# Patient Record
Sex: Female | Born: 1945 | Race: White | Hispanic: No | Marital: Married | Smoking: Former smoker
Health system: Southern US, Community
[De-identification: ages and names within clinical notes are randomized; demographics above are authoritative.]

## PROBLEM LIST (undated history)

## (undated) DIAGNOSIS — E785 Hyperlipidemia, unspecified: Secondary | ICD-10-CM

## (undated) DIAGNOSIS — G2581 Restless legs syndrome: Secondary | ICD-10-CM

## (undated) DIAGNOSIS — C4490 Unspecified malignant neoplasm of skin, unspecified: Secondary | ICD-10-CM

## (undated) HISTORY — DX: Restless legs syndrome: G25.81

## (undated) HISTORY — DX: Hyperlipidemia, unspecified: E78.5

## (undated) HISTORY — PX: CATARACT EXTRACTION: SUR2

## (undated) HISTORY — PX: TOTAL ABDOMINAL HYSTERECTOMY W/ BILATERAL SALPINGOOPHORECTOMY: SHX83

## (undated) HISTORY — PX: COLONOSCOPY: SHX174

## (undated) HISTORY — PX: TONSILLECTOMY: SUR1361

## (undated) HISTORY — DX: Unspecified malignant neoplasm of skin, unspecified: C44.90

## (undated) HISTORY — PX: GLAUCOMA SURGERY: SHX656

---

## 1999-08-26 ENCOUNTER — Encounter: Payer: Self-pay | Admitting: Obstetrics and Gynecology

## 1999-08-26 ENCOUNTER — Encounter: Admission: RE | Admit: 1999-08-26 | Discharge: 1999-08-26 | Payer: Self-pay | Admitting: Obstetrics and Gynecology

## 2000-09-14 ENCOUNTER — Encounter: Admission: RE | Admit: 2000-09-14 | Discharge: 2000-09-14 | Payer: Self-pay | Admitting: Obstetrics and Gynecology

## 2000-09-14 ENCOUNTER — Encounter: Payer: Self-pay | Admitting: Obstetrics and Gynecology

## 2001-01-11 HISTORY — PX: CYSTOSCOPY: SUR368

## 2001-06-14 ENCOUNTER — Encounter: Payer: Self-pay | Admitting: Internal Medicine

## 2001-09-28 ENCOUNTER — Encounter: Admission: RE | Admit: 2001-09-28 | Discharge: 2001-09-28 | Payer: Self-pay | Admitting: Obstetrics and Gynecology

## 2001-09-28 ENCOUNTER — Encounter: Payer: Self-pay | Admitting: Obstetrics and Gynecology

## 2001-12-15 ENCOUNTER — Encounter: Payer: Self-pay | Admitting: Internal Medicine

## 2001-12-15 ENCOUNTER — Encounter: Admission: RE | Admit: 2001-12-15 | Discharge: 2001-12-15 | Payer: Self-pay | Admitting: Internal Medicine

## 2002-10-10 ENCOUNTER — Encounter: Payer: Self-pay | Admitting: Obstetrics and Gynecology

## 2002-10-10 ENCOUNTER — Encounter: Admission: RE | Admit: 2002-10-10 | Discharge: 2002-10-10 | Payer: Self-pay | Admitting: Obstetrics and Gynecology

## 2002-10-26 ENCOUNTER — Encounter: Payer: Self-pay | Admitting: Family Medicine

## 2002-10-26 ENCOUNTER — Encounter: Admission: RE | Admit: 2002-10-26 | Discharge: 2002-10-26 | Payer: Self-pay | Admitting: Family Medicine

## 2003-10-21 ENCOUNTER — Encounter: Admission: RE | Admit: 2003-10-21 | Discharge: 2003-10-21 | Payer: Self-pay | Admitting: Obstetrics and Gynecology

## 2003-12-31 ENCOUNTER — Ambulatory Visit: Payer: Self-pay | Admitting: Internal Medicine

## 2004-01-01 ENCOUNTER — Ambulatory Visit: Payer: Self-pay | Admitting: Internal Medicine

## 2004-01-07 ENCOUNTER — Encounter: Admission: RE | Admit: 2004-01-07 | Discharge: 2004-01-07 | Payer: Self-pay | Admitting: Internal Medicine

## 2004-04-10 ENCOUNTER — Ambulatory Visit: Payer: Self-pay | Admitting: Internal Medicine

## 2004-04-30 ENCOUNTER — Encounter: Admission: RE | Admit: 2004-04-30 | Discharge: 2004-04-30 | Payer: Self-pay | Admitting: Internal Medicine

## 2004-08-19 ENCOUNTER — Ambulatory Visit: Payer: Self-pay | Admitting: Internal Medicine

## 2004-09-10 ENCOUNTER — Ambulatory Visit: Payer: Self-pay | Admitting: Internal Medicine

## 2005-01-15 ENCOUNTER — Encounter: Admission: RE | Admit: 2005-01-15 | Discharge: 2005-01-15 | Payer: Self-pay | Admitting: Internal Medicine

## 2005-06-11 ENCOUNTER — Encounter: Admission: RE | Admit: 2005-06-11 | Discharge: 2005-06-11 | Payer: Self-pay | Admitting: Internal Medicine

## 2006-01-13 ENCOUNTER — Ambulatory Visit: Payer: Self-pay | Admitting: Internal Medicine

## 2006-01-13 LAB — CONVERTED CEMR LAB
AST: 19 units/L (ref 0–37)
Albumin: 4.1 g/dL (ref 3.5–5.2)
Alkaline Phosphatase: 75 units/L (ref 39–117)
BUN: 11 mg/dL (ref 6–23)
Basophils Relative: 0.5 % (ref 0.0–1.0)
CO2: 28 meq/L (ref 19–32)
Calcium: 9.2 mg/dL (ref 8.4–10.5)
Eosinophil percent: 0.9 % (ref 0.0–5.0)
GFR calc non Af Amer: 77 mL/min
HCT: 42.2 % (ref 36.0–46.0)
Hemoglobin: 14.5 g/dL (ref 12.0–15.0)
LDL DIRECT: 104.5 mg/dL
Lymphocytes Relative: 19.1 % (ref 12.0–46.0)
Monocytes Absolute: 0.5 10*3/uL (ref 0.2–0.7)
Neutrophils Relative %: 71.3 % (ref 43.0–77.0)
Potassium: 4.1 meq/L (ref 3.5–5.1)
TSH: 1.5 microintl units/mL (ref 0.35–5.50)
Total Bilirubin: 1.1 mg/dL (ref 0.3–1.2)
Total Protein: 6.9 g/dL (ref 6.0–8.3)
Triglyceride fasting, serum: 48 mg/dL (ref 0–149)
WBC: 6.3 10*3/uL (ref 4.5–10.5)

## 2006-01-26 ENCOUNTER — Encounter: Admission: RE | Admit: 2006-01-26 | Discharge: 2006-01-26 | Payer: Self-pay | Admitting: Internal Medicine

## 2006-06-15 ENCOUNTER — Ambulatory Visit: Payer: Self-pay | Admitting: Internal Medicine

## 2006-06-15 LAB — CONVERTED CEMR LAB
Glucose, Urine, Semiquant: NEGATIVE
Ketones, urine, test strip: NEGATIVE
Protein, U semiquant: NEGATIVE
WBC Urine, dipstick: NEGATIVE
pH: 6.5

## 2006-09-26 ENCOUNTER — Ambulatory Visit: Payer: Self-pay | Admitting: Family Medicine

## 2006-09-26 LAB — CONVERTED CEMR LAB
Ketones, urine, test strip: NEGATIVE
Nitrite: NEGATIVE
Protein, U semiquant: NEGATIVE
pH: 6

## 2006-09-27 ENCOUNTER — Encounter: Payer: Self-pay | Admitting: Family Medicine

## 2006-10-04 ENCOUNTER — Encounter (INDEPENDENT_AMBULATORY_CARE_PROVIDER_SITE_OTHER): Payer: Self-pay | Admitting: *Deleted

## 2007-02-22 ENCOUNTER — Encounter: Admission: RE | Admit: 2007-02-22 | Discharge: 2007-02-22 | Payer: Self-pay | Admitting: Internal Medicine

## 2007-04-24 ENCOUNTER — Ambulatory Visit: Payer: Self-pay | Admitting: Internal Medicine

## 2007-04-24 LAB — CONVERTED CEMR LAB: Uric Acid, Serum: 3.8 mg/dL (ref 2.4–7.0)

## 2007-04-25 ENCOUNTER — Ambulatory Visit: Payer: Self-pay | Admitting: Internal Medicine

## 2007-05-31 ENCOUNTER — Ambulatory Visit: Payer: Self-pay | Admitting: Internal Medicine

## 2007-05-31 DIAGNOSIS — N959 Unspecified menopausal and perimenopausal disorder: Secondary | ICD-10-CM | POA: Insufficient documentation

## 2007-05-31 DIAGNOSIS — R7989 Other specified abnormal findings of blood chemistry: Secondary | ICD-10-CM | POA: Insufficient documentation

## 2007-05-31 DIAGNOSIS — M2550 Pain in unspecified joint: Secondary | ICD-10-CM | POA: Insufficient documentation

## 2007-05-31 DIAGNOSIS — Z85828 Personal history of other malignant neoplasm of skin: Secondary | ICD-10-CM

## 2007-05-31 LAB — CONVERTED CEMR LAB
ALT: 16 units/L (ref 0–35)
Alkaline Phosphatase: 61 units/L (ref 39–117)
Basophils Relative: 0.5 % (ref 0.0–1.0)
Bilirubin, Direct: 0.1 mg/dL (ref 0.0–0.3)
CO2: 29 meq/L (ref 19–32)
Chloride: 105 meq/L (ref 96–112)
Cholesterol: 207 mg/dL (ref 0–200)
Direct LDL: 98.7 mg/dL
Eosinophils Absolute: 0.1 10*3/uL (ref 0.0–0.7)
Eosinophils Relative: 2.1 % (ref 0.0–5.0)
GFR calc non Af Amer: 77 mL/min
Hemoglobin: 13.8 g/dL (ref 12.0–15.0)
MCV: 94.8 fL (ref 78.0–100.0)
Neutro Abs: 3.4 10*3/uL (ref 1.4–7.7)
Potassium: 3.8 meq/L (ref 3.5–5.1)
Sed Rate: 8 mm/hr (ref 0–22)
Sodium: 140 meq/L (ref 135–145)
Total Bilirubin: 0.8 mg/dL (ref 0.3–1.2)
Total CHOL/HDL Ratio: 2.6
Triglycerides: 50 mg/dL (ref 0–149)
WBC: 5.1 10*3/uL (ref 4.5–10.5)

## 2008-05-15 ENCOUNTER — Encounter: Admission: RE | Admit: 2008-05-15 | Discharge: 2008-05-15 | Payer: Self-pay | Admitting: Obstetrics and Gynecology

## 2008-05-21 ENCOUNTER — Encounter: Admission: RE | Admit: 2008-05-21 | Discharge: 2008-05-21 | Payer: Self-pay | Admitting: Obstetrics and Gynecology

## 2008-08-27 ENCOUNTER — Telehealth (INDEPENDENT_AMBULATORY_CARE_PROVIDER_SITE_OTHER): Payer: Self-pay | Admitting: *Deleted

## 2008-09-04 ENCOUNTER — Ambulatory Visit: Payer: Self-pay | Admitting: Internal Medicine

## 2008-09-11 LAB — CONVERTED CEMR LAB
Alkaline Phosphatase: 53 units/L (ref 39–117)
BUN: 15 mg/dL (ref 6–23)
Basophils Absolute: 0 10*3/uL (ref 0.0–0.1)
Bilirubin, Direct: 0 mg/dL (ref 0.0–0.3)
CO2: 30 meq/L (ref 19–32)
Calcium: 8.9 mg/dL (ref 8.4–10.5)
Chloride: 105 meq/L (ref 96–112)
Eosinophils Absolute: 0.1 10*3/uL (ref 0.0–0.7)
Eosinophils Relative: 2.1 % (ref 0.0–5.0)
Glucose, Bld: 92 mg/dL (ref 70–99)
HCT: 40.9 % (ref 36.0–46.0)
Lymphocytes Relative: 28.5 % (ref 12.0–46.0)
MCHC: 35 g/dL (ref 30.0–36.0)
Monocytes Relative: 10.4 % (ref 3.0–12.0)
Neutro Abs: 2.8 10*3/uL (ref 1.4–7.7)
Neutrophils Relative %: 58.9 % (ref 43.0–77.0)
Platelets: 219 10*3/uL (ref 150.0–400.0)
RBC: 4.31 M/uL (ref 3.87–5.11)
RDW: 12.5 % (ref 11.5–14.6)
Total Bilirubin: 1.1 mg/dL (ref 0.3–1.2)
Total CHOL/HDL Ratio: 2
VLDL: 11.6 mg/dL (ref 0.0–40.0)

## 2008-09-12 ENCOUNTER — Ambulatory Visit: Payer: Self-pay | Admitting: Internal Medicine

## 2008-09-12 DIAGNOSIS — E785 Hyperlipidemia, unspecified: Secondary | ICD-10-CM

## 2008-09-12 DIAGNOSIS — R9431 Abnormal electrocardiogram [ECG] [EKG]: Secondary | ICD-10-CM

## 2008-09-12 DIAGNOSIS — G2581 Restless legs syndrome: Secondary | ICD-10-CM | POA: Insufficient documentation

## 2008-09-12 DIAGNOSIS — IMO0001 Reserved for inherently not codable concepts without codable children: Secondary | ICD-10-CM

## 2008-09-12 DIAGNOSIS — M5430 Sciatica, unspecified side: Secondary | ICD-10-CM

## 2008-09-17 ENCOUNTER — Encounter (INDEPENDENT_AMBULATORY_CARE_PROVIDER_SITE_OTHER): Payer: Self-pay | Admitting: *Deleted

## 2008-10-17 ENCOUNTER — Encounter: Payer: Self-pay | Admitting: Internal Medicine

## 2008-11-19 ENCOUNTER — Encounter: Admission: RE | Admit: 2008-11-19 | Discharge: 2008-11-19 | Payer: Self-pay | Admitting: Internal Medicine

## 2009-05-09 ENCOUNTER — Ambulatory Visit: Payer: Self-pay | Admitting: Family Medicine

## 2009-05-09 ENCOUNTER — Ambulatory Visit: Payer: Self-pay

## 2009-05-09 DIAGNOSIS — M79609 Pain in unspecified limb: Secondary | ICD-10-CM | POA: Insufficient documentation

## 2009-05-22 ENCOUNTER — Encounter: Admission: RE | Admit: 2009-05-22 | Discharge: 2009-05-22 | Payer: Self-pay | Admitting: Internal Medicine

## 2009-06-02 ENCOUNTER — Encounter: Admission: RE | Admit: 2009-06-02 | Discharge: 2009-06-02 | Payer: Self-pay | Admitting: Orthopedic Surgery

## 2009-07-11 HISTORY — PX: KNEE SURGERY: SHX244

## 2009-09-05 ENCOUNTER — Telehealth (INDEPENDENT_AMBULATORY_CARE_PROVIDER_SITE_OTHER): Payer: Self-pay | Admitting: *Deleted

## 2009-10-17 ENCOUNTER — Encounter: Payer: Self-pay | Admitting: Internal Medicine

## 2009-10-23 ENCOUNTER — Telehealth: Payer: Self-pay | Admitting: Internal Medicine

## 2009-10-24 ENCOUNTER — Telehealth: Payer: Self-pay | Admitting: Internal Medicine

## 2009-10-27 ENCOUNTER — Ambulatory Visit: Payer: Self-pay | Admitting: Internal Medicine

## 2010-02-08 LAB — CONVERTED CEMR LAB: Ferritin: 69.1 ng/mL (ref 10.0–291.0)

## 2010-02-10 NOTE — Assessment & Plan Note (Signed)
Summary: SHINGLES VACC/PT MUST STAY 30 MIN/KB  Nurse Visit  CC: Shingles vacc./kb   Allergies: 1)  ! Neomycin 2)  ! * Polysporin  Immunizations Administered:  Zostavax # 1:    Vaccine Type: Zostavax    Site: left deltoid    Mfr: Merck    Dose: 0.5 ml    Route: [place]    Given by: Lucious Groves CMA    Exp. Date: 08/29/2010    Lot #: [id number]    VIS given: 10/23/04 given October 27, 2009.  Orders Added: 1)  Zoster (Shingles) Vaccine Live [90736] 2)  Admin 1st Vaccine [phone]

## 2010-02-10 NOTE — Miscellaneous (Signed)
Summary: Orders Update  Clinical Lists Changes  Orders: Added new Test order of Venous Duplex Lower Extremity (Venous Duplex Lower) - Signed 

## 2010-02-10 NOTE — Progress Notes (Signed)
Summary: Request for shingles vaccine  Phone Note Call from Patient   Summary of Call: Patient sent Dr.Hopper an email requesting rx for shingles vaccine be sent to [facility] on [address]. Patient spoke with pharmacist and he said if patient takes flu vaccine without a reaction they will give shingles injection  I called patient and left message on voicemail informing her rx sent in  Chrae [facility] CMA  September 05, 2009 3:29 PM     New/Updated Medications: ZOSTAVAX [id number] UNT/0.65ML SOLR (ZOSTER VACCINE LIVE) 0.34mL Subcutaneously x 1 Prescriptions: ZOSTAVAX [id number] UNT/0.65ML SOLR (ZOSTER VACCINE LIVE) 0.6mL Subcutaneously x 1  #1 x 0   Entered by:   Shonna Chock CMA   Authorized by:   Marga Melnick MD   Signed by:   Shonna Chock CMA on 09/05/2009   Method used:   Electronically to        [facility] [phone]* (retail)       [address]       [place], [place]  [address]       Ph: [phone]       Fax: [phone]   RxID:   [id number]

## 2010-02-10 NOTE — Miscellaneous (Signed)
Summary: [facility]   Imported By: Lanelle Bal 11/12/2009 11:57:09  _____________________________________________________________________  External Attachment:    Type:   Image     Comment:   External Document

## 2010-02-10 NOTE — Progress Notes (Signed)
Summary: Shingles vacc  Phone Note Call from Patient   Summary of Call: Patient called the office about her Shingles vaccine, she notes that per her insurance company she will have to pay for it out of pocket if it comes from our office or she can get it from the pharmacy for $45. Patient will bring the shot with her on Monday. Initial call taken by: Lucious Groves CMA,  October 24, 2009 10:39 AM    Prescriptions: ZOSTAVAX [id number] UNT/0.65ML SOLR (ZOSTER VACCINE LIVE) 0.74mL Subcutaneously x 1  #1 x 0   Entered by:   Lucious Groves CMA   Authorized by:   Marga Melnick MD   Signed by:   Lucious Groves CMA on 10/24/2009   Method used:   Electronically to        [facility] [phone]* (retail)       [address]       [place], [place]  [address]       Ph: [phone]       Fax: [phone]   RxID:   [id number]

## 2010-02-10 NOTE — Assessment & Plan Note (Signed)
Summary: SWELLING BACK OF LT KNEE.CBS   Vital Signs:  Patient profile:   65 year old female Height:      64.26 inches Weight:      140.50 pounds BMI:     24.01 Pulse rate:   70 / minute BP sitting:   120 / 60 CC: c/o knot  behind left knee Comments pt says I have sciatica problems, having increased pain when walking and knot behind left knee   History of Present Illness: 65 yo woman here today for pain and swelling behind L knee.  reports she is unable to put pressure on L leg.  sxs started 'a couple of days ago'.  first felt a 'tightness but that didn't really bother me'.  flared yesterday after the elliptical.  no recent car trips or plane rides, no recent inactivity.  Current Medications (verified): 1)  Premarin  Crea (Estrogens, Conjugated Crea) .... Use Daily 2)  Restasis 0.05 % Emul (Cyclosporine) .... Daily 3)  Biotin  Tabs (Biotin Tabs) .... Daily 4)  Calcium 600mg  Plus-D .Marland Kitchen.. Three Times A Day 5)  Citalopram Hydrobromide 20 Mg  Tabs (Citalopram Hydrobromide) .Marland Kitchen.. 1 Qd 6)  Omega 3 7)  Cyclobenzaprine Hcl 10 Mg Tabs (Cyclobenzaprine Hcl) .Marland Kitchen.. 1 At Bedtime As Needed 8)  Gabapentin 100 Mg Caps (Gabapentin) .Marland Kitchen.. 1 Every 8 Hours As Needed For Leg Pain  Allergies (verified): 1)  ! Neomycin 2)  ! * Polysporin  Past History:  Past Medical History: Last updated: 09/12/2008 Microscopic  hematuria   evaluated by Dr Aldean Ast ,normal Cystoscopy 2003. No recurrence since. Cataracts; PMH of glaucoma corrected by laser  surgery, Dr Hazle Quant Skin cancer, hx of basal & squamous cell, atypical nevus, Dr Amy [place] Hyperlipidemia, due to very high HDL  Review of Systems      See HPI  Physical Exam  General:  alert,appropriate and cooperative throughout examination; appears healthy.  came in wheelchair due to pain w/ walking Extremities:  no C/C/E, (-) Homan's.  + swelling behind L knee, no TTP but very painful to walk Neurologic:  strength normal in all extremities, sensation intact  to light touch, and DTRs symmetrical and normal.     Impression & Recommendations:  Problem # 1:  LEG PAIN, LEFT (ICD-729.5) Assessment New pt's pain likely due to Baker's cyst but must r/o DVT.  will get [place].  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  spoke w/ pt via phone and advised her to go to [facility] for tx (likely drainage).  pt in agreement. Orders: Radiology Referral (Radiology) Venous Duplex Lower Extremity (Venous Duplex Lower)  Complete Medication List: 1)  Premarin Crea (Estrogens, conjugated crea) .... Use daily 2)  Restasis 0.05 % Emul (Cyclosporine) .... Daily 3)  Biotin Tabs (Biotin tabs) .... Daily 4)  Calcium 600mg  Plus-d  .... Three times a day 5)  Citalopram Hydrobromide 20 Mg Tabs (Citalopram hydrobromide) .Marland Kitchen.. 1 qd 6)  Omega 3  7)  Cyclobenzaprine Hcl 10 Mg Tabs (Cyclobenzaprine hcl) .Marland Kitchen.. 1 at bedtime as needed 8)  Gabapentin 100 Mg Caps (Gabapentin) .Marland Kitchen.. 1 every 8 hours as needed for leg pain  Patient Instructions: 1)  Go to [address] [facility] to get your ultrasound 2)  We'll notify you of your results 3)  Take the ibuprofen (up to 4 tabs every 8 hours) for pain and inflammation 4)  Ice the area 5)  Pump the leg to dissipate the fluid behind the knee 6)  As best you can, try and walk on it 7)  Hang in there!

## 2010-02-10 NOTE — Progress Notes (Signed)
Summary: shingles vacc  Phone Note Call from Patient   Summary of Call: Patient left message on triage that she needed shingles vacc, i see that this was done in Aug by Duwayne Heck. Left message on voicemail to call back to office. Lucious Groves CMA  October 23, 2009 3:07 PM   Follow-up for Phone Call        Patient did not get the shingles vacc back in Aug due to an allergy issue and Hopper recommending she get it done in office. Patient schedule appt for shingles inj. Follow-up by: Lucious Groves CMA,  October 23, 2009 3:42 PM

## 2010-03-02 ENCOUNTER — Encounter: Payer: Self-pay | Admitting: Internal Medicine

## 2010-03-11 ENCOUNTER — Encounter: Payer: Self-pay | Admitting: Internal Medicine

## 2010-03-11 ENCOUNTER — Ambulatory Visit (INDEPENDENT_AMBULATORY_CARE_PROVIDER_SITE_OTHER): Payer: Medicare Other | Admitting: Internal Medicine

## 2010-03-11 DIAGNOSIS — R300 Dysuria: Secondary | ICD-10-CM

## 2010-03-11 LAB — CONVERTED CEMR LAB
Bilirubin Urine: NEGATIVE
Glucose, Urine, Semiquant: NEGATIVE
Specific Gravity, Urine: <1.005
Urobilinogen, UA: 0.2
pH: 7.5

## 2010-03-12 ENCOUNTER — Encounter: Payer: Self-pay | Admitting: Internal Medicine

## 2010-03-14 LAB — HM COLONOSCOPY: HM Colonoscopy: NEGATIVE

## 2010-03-19 NOTE — Assessment & Plan Note (Signed)
Summary: ?uti/nta   Vital Signs:  Patient profile:   65 year old female Height:      64.26 inches (163.22 cm) Weight:      142.38 pounds (64.72 kg) BMI:     24.33 Temp:     98.2 degrees F (36.78 degrees C) oral Resp:     14 per minute BP sitting:   110 / 70  (left arm) Cuff size:   regular  Vitals Entered By: Lucious Groves CMA (March 11, 2010 10:26 AM) CC: Possible UTI./kb, Dysuria Is Patient Diabetic? No Pain Assessment Patient in pain? no      Comments Patient notes that she has been having inceased frequency, blood in urine, and dysuria. She denies abd pain, and fever, but has had night sweats.   Primary Care Provider:  Laury Axon  CC:  Possible UTI./kb and Dysuria.  History of Present Illness:    Onset was 03/08/2010 as dysuria, treated with cranberry juice w/o significant benefit .She now    reports burning with urination, urinary frequency, urgency, and hematuria.  The patient denies the following associated symptoms: nausea, vomiting, fever, shaking chills, flank pain, abdominal pain, back pain, and pelvic pain.  History is significant for no urinary tract problems.    Current Medications (verified): 1)  Premarin  Crea (Estrogens, Conjugated Crea) .... Use Daily 2)  Restasis 0.05 % Emul (Cyclosporine) .... Daily 3)  Calcium 600mg  Plus-D .Marland Kitchen.. Three Times A Day 4)  Citalopram Hydrobromide 20 Mg  Tabs (Citalopram Hydrobromide) .Marland Kitchen.. 1 Qd 5)  Omega 3  Allergies (verified): 1)  ! Neomycin 2)  ! * Polysporin  Physical Exam  General:  in no acute distress; alert,appropriate and cooperative throughout examination Abdomen:  Bowel sounds positive,abdomen soft and non-tender without masses, organomegaly or hernias noted. Msk:  No flank tenderness Skin:  Intact without suspicious lesions or rashes Cervical Nodes:  No lymphadenopathy noted Axillary Nodes:  No palpable lymphadenopathy   Impression & Recommendations:  Problem # 1:  DYSURIA (ICD-788.1)  Orders: UA  Dipstick w/o Micro (manual) (16109) T-Culture, Urine ([id number]) Prescription Created Electronically [phone])  Her updated medication list for this problem includes:    Nitrofurantoin Monohyd Macro 100 Mg Caps (Nitrofurantoin monohyd macro) .Marland Kitchen... 1 two times a day  Complete Medication List: 1)  Premarin Crea (Estrogens, conjugated crea) .... Use daily 2)  Restasis 0.05 % Emul (Cyclosporine) .... Daily 3)  Calcium 600mg  Plus-d  .... Three times a day 4)  Citalopram Hydrobromide 20 Mg Tabs (Citalopram hydrobromide) .Marland Kitchen.. 1 qd 5)  Omega 3  6)  Nitrofurantoin Monohyd Macro 100 Mg Caps (Nitrofurantoin monohyd macro) .Marland Kitchen.. 1 two times a day 7)  Phenazo 200 Mg Tabs (Phenazopyridine hcl) .Marland Kitchen.. 1 two times a day as needed  Patient Instructions: 1)  Drink as much fluid as you can tolerate for the next few days. Prescriptions: PHENAZO 200 MG TABS (PHENAZOPYRIDINE HCL) 1 two times a day as needed  #21 x 0   Entered and Authorized by:   Marga Melnick MD   Signed by:   Marga Melnick MD on 03/11/2010   Method used:   Electronically to        [facility]* (retail)       [address]       [place], [place]  [id number]       Ph: [phone] or [phone]       Fax: [phone]   RxID:   [id number] NITROFURANTOIN MONOHYD MACRO 100 MG CAPS (NITROFURANTOIN MONOHYD MACRO)  1 two times a day  #20 x 0   Entered and Authorized by:   Marga Melnick MD   Signed by:   Marga Melnick MD on 03/11/2010   Method used:   Electronically to        [facility]* (retail)       [address]       [place], [place]  [id number]       Ph: [phone] or [phone]       Fax: [phone]   RxID:   [id number]    Orders Added: 1)  UA Dipstick w/o Micro (manual) [81002] 2)  T-Culture, Urine [[id number]] 3)  Est. Patient Level III [87564] 4)  Prescription Created Electronically [phone]    Laboratory Results   Urine Tests  Date/Time Received: Lucious Groves [facility]  March 11, 2010 10:29 AM  Date/Time Reported: Lucious Groves CMA  March 11, 2010 10:29 AM   Routine Urinalysis   Color: colorless Appearance: Clear Glucose: negative   (Normal Range: Negative) Bilirubin: negative   (Normal Range: Negative) Ketone: negative   (Normal Range: Negative) Spec. Gravity: <1.005   (Normal Range: 1.003-1.035) Blood: large   (Normal Range: Negative) pH: 7.5   (Normal Range: 5.0-8.0) Protein: negative   (Normal Range: Negative) Urobilinogen: 0.2   (Normal Range: 0-1) Nitrite: negative   (Normal Range: Negative) Leukocyte Esterace: small   (Normal Range: Negative)

## 2010-03-24 NOTE — Letter (Signed)
Summary: [facility]   Imported By: Maryln Gottron 03/17/2010 15:22:35  _____________________________________________________________________  External Attachment:    Type:   Image     Comment:   External Document

## 2010-04-06 ENCOUNTER — Encounter: Payer: Self-pay | Admitting: Internal Medicine

## 2010-07-24 ENCOUNTER — Other Ambulatory Visit: Payer: Self-pay | Admitting: Internal Medicine

## 2010-07-31 ENCOUNTER — Other Ambulatory Visit: Payer: Self-pay | Admitting: Internal Medicine

## 2010-07-31 DIAGNOSIS — Z1231 Encounter for screening mammogram for malignant neoplasm of breast: Secondary | ICD-10-CM

## 2010-08-12 ENCOUNTER — Ambulatory Visit
Admission: RE | Admit: 2010-08-12 | Discharge: 2010-08-12 | Disposition: A | Discharge status: Home / Self Care | Payer: Medicare Other | Source: Ambulatory Visit | Attending: Internal Medicine | Admitting: Internal Medicine

## 2010-08-12 DIAGNOSIS — Z1231 Encounter for screening mammogram for malignant neoplasm of breast: Secondary | ICD-10-CM

## 2011-01-08 ENCOUNTER — Other Ambulatory Visit: Payer: Self-pay | Admitting: Internal Medicine

## 2011-07-29 ENCOUNTER — Other Ambulatory Visit: Payer: Self-pay | Admitting: Internal Medicine

## 2011-07-29 NOTE — Telephone Encounter (Signed)
Patient needs to schedule a CPX  

## 2011-09-12 HISTORY — PX: HEMORROIDECTOMY: SUR656

## 2011-10-13 ENCOUNTER — Other Ambulatory Visit: Payer: Self-pay | Admitting: Internal Medicine

## 2011-10-13 DIAGNOSIS — Z1231 Encounter for screening mammogram for malignant neoplasm of breast: Secondary | ICD-10-CM

## 2011-10-25 ENCOUNTER — Other Ambulatory Visit: Payer: Self-pay | Admitting: Internal Medicine

## 2011-10-25 NOTE — Telephone Encounter (Signed)
Please contact patient for CPX, once schedule medication will be filled. Last OV greater than 1 year

## 2011-10-26 NOTE — Telephone Encounter (Signed)
Pt scheduled  

## 2011-11-08 ENCOUNTER — Ambulatory Visit
Admission: RE | Admit: 2011-11-08 | Discharge: 2011-11-08 | Disposition: A | Discharge status: Home / Self Care | Payer: Medicare Other | Source: Ambulatory Visit | Attending: Internal Medicine | Admitting: Internal Medicine

## 2011-11-08 DIAGNOSIS — Z1231 Encounter for screening mammogram for malignant neoplasm of breast: Secondary | ICD-10-CM

## 2012-01-03 ENCOUNTER — Ambulatory Visit (INDEPENDENT_AMBULATORY_CARE_PROVIDER_SITE_OTHER): Payer: Medicare Other | Admitting: Internal Medicine

## 2012-01-03 ENCOUNTER — Encounter: Payer: Self-pay | Admitting: Internal Medicine

## 2012-01-03 VITALS — BP 116/70 | HR 66 | Temp 98.3°F | Resp 12 | Ht 63.5 in | Wt 141.8 lb

## 2012-01-03 DIAGNOSIS — E559 Vitamin D deficiency, unspecified: Secondary | ICD-10-CM

## 2012-01-03 DIAGNOSIS — Z0000 Encounter for general adult medical examination without abnormal findings: Secondary | ICD-10-CM

## 2012-01-03 DIAGNOSIS — Z85828 Personal history of other malignant neoplasm of skin: Secondary | ICD-10-CM

## 2012-01-03 DIAGNOSIS — E785 Hyperlipidemia, unspecified: Secondary | ICD-10-CM

## 2012-01-03 DIAGNOSIS — IMO0002 Reserved for concepts with insufficient information to code with codable children: Secondary | ICD-10-CM

## 2012-01-03 DIAGNOSIS — G2581 Restless legs syndrome: Secondary | ICD-10-CM

## 2012-01-03 DIAGNOSIS — R9431 Abnormal electrocardiogram [ECG] [EKG]: Secondary | ICD-10-CM

## 2012-01-03 DIAGNOSIS — M5416 Radiculopathy, lumbar region: Secondary | ICD-10-CM

## 2012-01-03 DIAGNOSIS — Z23 Encounter for immunization: Secondary | ICD-10-CM

## 2012-01-03 MED ORDER — TRAMADOL HCL 50 MG PO TABS: ORAL_TABLET | ORAL | Status: DC

## 2012-01-03 NOTE — Progress Notes (Signed)
Subjective:    Patient ID: Diana Lambert, female    DOB: [birth year], 66 y.o.   MRN: [id number]  HPI Medicare Wellness Visit:  The following psychosocial & medical history were reviewed as required by Medicare.   Social history: caffeine: 2 cups / day , alcohol:  14/ week ,  tobacco use : quit 1972  & exercise : Pilates 1 X/ day.   Home & personal  safety / fall risk: no issues, activities of daily living: no limitations , seatbelt use : yes , and smoke alarm employment : yes .  Power of Attorney/Living Will status : in place  Vision ( as recorded per Nurse) & Hearing  evaluation :  Ophth exam up to date; no hearing exam . Orientation :oriented X 3 , memory & recall :good, spelling testing: good,and mood & affect : normal . Depression / anxiety:  Travel history : last in [place] 2011 , immunization status :up to date , transfusion history:  no, and preventive health surveillance ( colonoscopies, BMD , etc as per protocol/ [facility]): colonoscopy up to date, Dental care:  Seen every 6 mos . Chart reviewed &  Updated. Active issues reviewed & addressed.       Review of Systems  Back/Extremity pain Location: LS spine  & L hip /leg Onset:3 mos ago Trigger/injury:no Pain quality: variable Pain severity:up to 5 Duration:constant Radiation:from hip to knee Exacerbating factors:sleeping Treatment/response:NSAIDS help   Review of systems: Constitutional: no fever, chills, sweats, change in weight  Musculoskeletal:no  muscle cramps or pain; no  joint stiffness, redness, or swelling Skin:no rash, color/ temp change Neuro: no weakness; incontinence (stool/urine); numbness and tingling Heme:no lymphadenopathy; abnormal bruising or bleeding        Objective:   Physical Exam Gen.:  well-nourished in appearance. Alert, appropriate and cooperative throughout exam. Head: Normocephalic without obvious abnormalities  Eyes: No corneal or conjunctival inflammation noted. Pupils equal round reactive to  light and accommodation. Fundal exam is benign without hemorrhages, exudate, papilledema. Extraocular motion intact. Vision grossly normal. Ears: External  ear exam reveals no significant lesions or deformities. Canals clear .TMs normal. Hearing is grossly normal bilaterally. Nose: External nasal exam reveals no deformity or inflammation. Nasal mucosa are pink and moist. No lesions or exudates noted.  Mouth: Oral mucosa and oropharynx reveal no lesions or exudates. Teeth in good repair. Neck: No deformities, masses, or tenderness noted. Range of motion & Thyroid normal. Lungs: Normal respiratory effort; chest expands symmetrically. Lungs are clear to auscultation without rales, wheezes, or increased work of breathing. Heart: Normal rate and rhythm. Normal S1 and S2. No gallop, click, or rub. S4 w/o murmur. Abdomen: Bowel sounds normal; abdomen soft and nontender. No masses, organomegaly or hernias noted. Genitalia:Beth lane , Gyn NP                                                            Musculoskeletal/extremities: No deformity or scoliosis noted of  the thoracic or lumbar spine.Very firm lower thoracic musculature. No clubbing, cyanosis, edema, or deformity noted. Range of motion  normal .Tone & strength  normal.Joints normal. Nail health  good. She is able to lie flat and sit up without help. Straight leg raising is negative to 90 bilaterally. There is no limitation of range of motion of  the hips Vascular: Carotid, radial artery, dorsalis pedis and  posterior tibial pulses are full and equal. No bruits present. Neurologic: Alert and oriented x3. Deep tendon reflexes symmetrical and normal.          Skin: Intact without suspicious lesions or rashes. Lymph: No cervical, axillary lymphadenopathy present. Psych: Mood and affect are normal. Normally interactive                                                                                         Assessment & Plan:  #1 Medicare Wellness  Exam; criteria met ; data entered #2 lumbar radiculopathy Plan: see Orders

## 2012-01-03 NOTE — Patient Instructions (Addendum)
The best exercises for the low back include freestyle swimming, stretch aerobics, and yoga.  Please  schedule fasting Labs : BMET,Lipids, hepatic panel, CBC & dif, TSH, vitamin D level. PLEASE BRING THESE INSTRUCTIONS TO FOLLOW UP  LAB APPOINTMENT.This will guarantee correct labs are drawn, eliminating need for repeat blood sampling ( needle sticks ! ). Diagnoses /Codes:272.4, V10.83,268.9.  Take the EKG to any emergency room or preop visits. There are nonspecific changes; as long as there is no new change these are not clinically significant  If you activate My Chart; the results can be released to you as soon as they populate from the lab. If you choose not to use this program; the labs have to be reviewed, copied & mailed   causing a delay in getting the results to you.

## 2012-01-06 MED ORDER — CITALOPRAM HYDROBROMIDE 40 MG PO TABS: 40.0000 mg | ORAL_TABLET | Freq: Every day | ORAL | Status: DC

## 2012-01-06 NOTE — Addendum Note (Signed)
Addended byMarga Melnick F on: 01/06/2012 12:59 PM   Modules accepted: Orders

## 2012-01-07 ENCOUNTER — Other Ambulatory Visit (INDEPENDENT_AMBULATORY_CARE_PROVIDER_SITE_OTHER): Payer: Medicare Other

## 2012-01-07 DIAGNOSIS — E559 Vitamin D deficiency, unspecified: Secondary | ICD-10-CM

## 2012-01-07 DIAGNOSIS — Z85828 Personal history of other malignant neoplasm of skin: Secondary | ICD-10-CM

## 2012-01-07 DIAGNOSIS — E785 Hyperlipidemia, unspecified: Secondary | ICD-10-CM

## 2012-01-07 LAB — BASIC METABOLIC PANEL
BUN: 13 mg/dL (ref 6–23)
CO2: 26 mEq/L (ref 19–32)
Calcium: 8.9 mg/dL (ref 8.4–10.5)
Creatinine, Ser: 0.7 mg/dL (ref 0.4–1.2)
Glucose, Bld: 88 mg/dL (ref 70–99)

## 2012-01-07 LAB — HEPATIC FUNCTION PANEL
ALT: 16 U/L (ref 0–35)
AST: 18 U/L (ref 0–37)
Alkaline Phosphatase: 56 U/L (ref 39–117)
Bilirubin, Direct: 0.1 mg/dL (ref 0.0–0.3)
Total Bilirubin: 0.8 mg/dL (ref 0.3–1.2)

## 2012-01-07 LAB — CBC WITH DIFFERENTIAL/PLATELET
Eosinophils Absolute: 0.1 10*3/uL (ref 0.0–0.7)
HCT: 38.5 % (ref 36.0–46.0)
Lymphs Abs: 1.1 10*3/uL (ref 0.7–4.0)
MCHC: 34 g/dL (ref 30.0–36.0)
MCV: 94.5 fl (ref 78.0–100.0)
Monocytes Absolute: 0.4 10*3/uL (ref 0.1–1.0)
Neutrophils Relative %: 68.5 % (ref 43.0–77.0)
Platelets: 226 10*3/uL (ref 150.0–400.0)

## 2012-01-10 ENCOUNTER — Encounter: Payer: Self-pay | Admitting: *Deleted

## 2012-01-13 ENCOUNTER — Encounter: Payer: Self-pay | Admitting: *Deleted

## 2012-01-13 ENCOUNTER — Ambulatory Visit (INDEPENDENT_AMBULATORY_CARE_PROVIDER_SITE_OTHER)
Admission: RE | Admit: 2012-01-13 | Discharge: 2012-01-13 | Disposition: A | Discharge status: Home / Self Care | Payer: Medicare Other | Source: Ambulatory Visit

## 2012-01-13 DIAGNOSIS — E559 Vitamin D deficiency, unspecified: Secondary | ICD-10-CM

## 2012-06-23 ENCOUNTER — Other Ambulatory Visit: Payer: Self-pay | Admitting: Internal Medicine

## 2012-07-24 ENCOUNTER — Other Ambulatory Visit: Payer: Self-pay | Admitting: Family Medicine

## 2012-10-25 ENCOUNTER — Other Ambulatory Visit: Payer: Self-pay

## 2012-10-25 DIAGNOSIS — Z1231 Encounter for screening mammogram for malignant neoplasm of breast: Secondary | ICD-10-CM

## 2012-11-22 ENCOUNTER — Ambulatory Visit
Admission: RE | Admit: 2012-11-22 | Discharge: 2012-11-22 | Disposition: A | Discharge status: Home / Self Care | Payer: Medicare Other | Source: Ambulatory Visit

## 2012-11-22 DIAGNOSIS — Z1231 Encounter for screening mammogram for malignant neoplasm of breast: Secondary | ICD-10-CM

## 2012-12-04 ENCOUNTER — Other Ambulatory Visit: Payer: Self-pay | Admitting: Internal Medicine

## 2013-01-12 ENCOUNTER — Encounter: Payer: Medicare Other | Admitting: Internal Medicine

## 2013-01-16 ENCOUNTER — Telehealth: Payer: Self-pay

## 2013-01-16 NOTE — Telephone Encounter (Addendum)
Left message for call back  identifiable Medication and allergies:  Reviewed and updated  90 day supply/mail order: na Local pharmacy: [facility] [address]   Immunizations due:  UTD  A/P:   No changes to [place] or PSH or personal hx Pap--Beth Orene Desanctis NP/Gyn [facility]--03/14/2010--[facility]--neg Dexa--01/2012 Tdap--05/2007 Flu vaccine--09/2012 PNA--12/2011 Shingles--10/2009  To Discuss with Provider: Not at this time

## 2013-01-17 ENCOUNTER — Encounter: Payer: Self-pay | Admitting: Internal Medicine

## 2013-01-17 ENCOUNTER — Ambulatory Visit (INDEPENDENT_AMBULATORY_CARE_PROVIDER_SITE_OTHER): Payer: Medicare HMO | Admitting: Internal Medicine

## 2013-01-17 VITALS — BP 115/71 | HR 72 | Temp 98.3°F | Resp 16 | Ht 63.5 in | Wt 144.0 lb

## 2013-01-17 DIAGNOSIS — M8580 Other specified disorders of bone density and structure, unspecified site: Secondary | ICD-10-CM

## 2013-01-17 DIAGNOSIS — E559 Vitamin D deficiency, unspecified: Secondary | ICD-10-CM

## 2013-01-17 DIAGNOSIS — M899 Disorder of bone, unspecified: Secondary | ICD-10-CM

## 2013-01-17 DIAGNOSIS — Z0000 Encounter for general adult medical examination without abnormal findings: Secondary | ICD-10-CM

## 2013-01-17 DIAGNOSIS — E785 Hyperlipidemia, unspecified: Secondary | ICD-10-CM

## 2013-01-17 DIAGNOSIS — Z85828 Personal history of other malignant neoplasm of skin: Secondary | ICD-10-CM

## 2013-01-17 DIAGNOSIS — G2581 Restless legs syndrome: Secondary | ICD-10-CM

## 2013-01-17 DIAGNOSIS — M949 Disorder of cartilage, unspecified: Secondary | ICD-10-CM

## 2013-01-17 LAB — BASIC METABOLIC PANEL
BUN: 11 mg/dL (ref 6–23)
CALCIUM: 9.6 mg/dL (ref 8.4–10.5)
CO2: 27 mEq/L (ref 19–32)
Chloride: 104 mEq/L (ref 96–112)
Creatinine, Ser: 0.7 mg/dL (ref 0.4–1.2)
GFR: 83.77 mL/min (ref >60.00)
Glucose, Bld: 88 mg/dL (ref 70–99)
POTASSIUM: 4.3 meq/L (ref 3.5–5.1)
SODIUM: 138 meq/L (ref 135–145)

## 2013-01-17 LAB — CBC WITH DIFFERENTIAL/PLATELET
Basophils Absolute: 0 10*3/uL (ref 0.0–0.1)
Basophils Relative: 0.3 % (ref 0.0–3.0)
EOS PCT: 1 % (ref 0.0–5.0)
Eosinophils Absolute: 0.1 10*3/uL (ref 0.0–0.7)
HEMATOCRIT: 40.2 % (ref 36.0–46.0)
HEMOGLOBIN: 13.7 g/dL (ref 12.0–15.0)
LYMPHS ABS: 1.2 10*3/uL (ref 0.7–4.0)
Lymphocytes Relative: 20.6 % (ref 12.0–46.0)
MCHC: 34.1 g/dL (ref 30.0–36.0)
MCV: 93.7 fl (ref 78.0–100.0)
MONOS PCT: 6.7 % (ref 3.0–12.0)
Monocytes Absolute: 0.4 10*3/uL (ref 0.1–1.0)
Neutro Abs: 4.1 10*3/uL (ref 1.4–7.7)
Neutrophils Relative %: 71.4 % (ref 43.0–77.0)
PLATELETS: 247 10*3/uL (ref 150.0–400.0)
RBC: 4.29 Mil/uL (ref 3.87–5.11)
RDW: 14 % (ref 11.5–14.6)
WBC: 5.8 10*3/uL (ref 4.5–10.5)

## 2013-01-17 LAB — HEPATIC FUNCTION PANEL
ALT: 23 U/L (ref 0–35)
AST: 22 U/L (ref 0–37)
Albumin: 4.4 g/dL (ref 3.5–5.2)
Alkaline Phosphatase: 53 U/L (ref 39–117)
BILIRUBIN DIRECT: 0.1 mg/dL (ref 0.0–0.3)
Total Bilirubin: 1.1 mg/dL (ref 0.3–1.2)
Total Protein: 6.9 g/dL (ref 6.0–8.3)

## 2013-01-17 LAB — TSH: TSH: 1.47 u[IU]/mL (ref 0.35–5.50)

## 2013-01-17 MED ORDER — TRAMADOL HCL 50 MG PO TABS: 50.0000 mg | ORAL_TABLET | Freq: Three times a day (TID) | ORAL | Status: DC | PRN

## 2013-01-17 MED ORDER — CITALOPRAM HYDROBROMIDE 40 MG PO TABS: ORAL_TABLET | ORAL | Status: DC

## 2013-01-17 NOTE — Progress Notes (Signed)
Subjective:    Patient ID: Diana Lambert, female    DOB: Dec 17, 1945, 68 y.o.   MRN: [id number]  HPI Medicare Wellness Visit: Psychosocial and medical history were reviewed as required by Medicare (history related to abuse, antisocial behavior , firearm risk). Social history: Caffeine: 3 cups coffee/ day , Alcohol: 14 glasses/ week , Tobacco HBZ:JIRC 1972 Exercise:Pilates 1 X / week Personal safety/fall risk:no Limitations of activities of daily living:no Seatbelt/ smoke alarm use:yes Healthcare Power of Attorney/Living Will status: in place Ophthalmologic exam status:current Hearing evaluation status:current Orientation: Oriented X 3 Memory and recall: good Math testing: good Depression/anxiety assessment: no Foreign travel history:[place] 2013 Immunization status for influenza/pneumonia/ shingles /tetanus:? PNA needed Transfusion history:no Preventive health care maintenance status: Colonoscopy/BMD/mammogram/Pap as per protocol/standard care:Gyn F/U pending Dental care:every 6 mos Chart reviewed and updated. Active issues reviewed and addressed as documented below.    Review of Systems  She is having disrupted sleep in the context of restless leg syndrome , sciatica & occasional R knee pain. She has no past history of anemia. She has seen a chiropractor for the sciatica. Citalopram is effective for anxiety & mood issues.     Objective:   Physical Exam Gen.: Healthy and well-nourished in appearance. Alert, appropriate and cooperative throughout exam.Appears younger than stated age  Head: Normocephalic without obvious abnormalities Eyes: No corneal or conjunctival inflammation noted. Pupils equal round reactive to light and accommodation. Extraocular motion intact. Ears: External  ear exam reveals no significant lesions or deformities. Canals clear .TMs normal. Hearing is grossly normal bilaterally. Nose: External nasal exam reveals no deformity or inflammation. Nasal mucosa are  pink and moist. No lesions or exudates noted.   Mouth: Oral mucosa and oropharynx reveal no lesions or exudates. Teeth in good repair. Neck: No deformities, masses, or tenderness noted. Range of motion & Thyroid normal. Lungs: Normal respiratory effort; chest expands symmetrically. Lungs are clear to auscultation without rales, wheezes, or increased work of breathing. Heart: Normal rate and rhythm. Normal S1 and S2. No gallop, click, or rub. S4 w/o murmur. Abdomen: Bowel sounds normal; abdomen soft and nontender. No masses, organomegaly or hernias noted.Aorta palpable ; no AAA Genitalia:  as per Gyn                                  Musculoskeletal/extremities: No deformity or scoliosis noted of  the thoracic or lumbar spine.  No clubbing, cyanosis, edema, or significant extremity  deformity noted. Range of motion normal .Tone & strength normal.Slight crepitus R knee. Hand joints normal . Fingernail  health good. Able to lie down & sit up w/o help. Negative SLR bilaterally Vascular: Carotid, radial artery, dorsalis pedis and  posterior tibial pulses are  Equal.Slightly decreased DPP. No bruits present. Neurologic: Alert and oriented x3. Deep tendon reflexes symmetrical and normal.      Skin: Intact without suspicious lesions or rashes. Lymph: No cervical, axillary lymphadenopathy present. Psych: Mood and affect are normal. Normally interactive  Assessment & Plan:  #1 Medicare Wellness Exam; criteria met ; data entered #2 Problem List/Diagnoses reviewed Plan:  Assessments made/ Orders entered  

## 2013-01-17 NOTE — Progress Notes (Signed)
Pre-visit discussion using our clinic review tool. No additional management support is needed unless otherwise documented below in the visit note.  

## 2013-01-17 NOTE — Patient Instructions (Addendum)
Your next office appointment will be determined based upon review of your pending labs. Those instructions will be transmitted to you through My Chart  or by mail if you're not using this system.  Followup as needed for your this acute issue. Please report any significant change in your symptoms. Use an anti-inflammatory cream such as Aspercreme or Zostrix cream twice a day to the affected area as needed. In lieu of this warm moist compresses or  hot water bottle can be used. Do not apply ice . Consider glucosamine (+chrondroitin ) 1500 mg daily for joint symptoms. Take this daily  for 3 months and then leave it off for 2 months. This will rehydrate the cartilages.

## 2013-01-21 LAB — VITAMIN D 1,25 DIHYDROXY
Vitamin D 1, 25 (OH)2 Total: 65 pg/mL (ref 18–72)
Vitamin D2 1, 25 (OH)2: <8 pg/mL
Vitamin D3 1, 25 (OH)2: 65 pg/mL

## 2013-01-22 ENCOUNTER — Encounter: Payer: Self-pay | Admitting: Internal Medicine

## 2013-05-03 ENCOUNTER — Other Ambulatory Visit: Payer: Self-pay | Admitting: *Deleted

## 2013-05-03 ENCOUNTER — Ambulatory Visit
Admission: RE | Admit: 2013-05-03 | Discharge: 2013-05-03 | Disposition: A | Discharge status: Home / Self Care | Payer: Medicare HMO | Source: Ambulatory Visit | Attending: *Deleted | Admitting: *Deleted

## 2013-05-03 DIAGNOSIS — M5430 Sciatica, unspecified side: Secondary | ICD-10-CM

## 2013-06-12 ENCOUNTER — Other Ambulatory Visit: Payer: Self-pay | Admitting: Internal Medicine

## 2013-06-12 MED ORDER — DIAZEPAM 5 MG PO TABS: 5.0000 mg | ORAL_TABLET | Freq: Two times a day (BID) | ORAL | Status: AC | PRN

## 2013-09-04 ENCOUNTER — Other Ambulatory Visit: Payer: Self-pay | Admitting: Internal Medicine

## 2013-09-04 NOTE — Telephone Encounter (Signed)
Last seen 01/17/13

## 2013-09-04 NOTE — Telephone Encounter (Signed)
OK X1 #45

## 2013-09-12 ENCOUNTER — Ambulatory Visit (INDEPENDENT_AMBULATORY_CARE_PROVIDER_SITE_OTHER)
Admission: RE | Admit: 2013-09-12 | Discharge: 2013-09-12 | Disposition: A | Discharge status: Home / Self Care | Payer: Medicare HMO | Source: Ambulatory Visit | Attending: Internal Medicine | Admitting: Internal Medicine

## 2013-09-12 ENCOUNTER — Ambulatory Visit (INDEPENDENT_AMBULATORY_CARE_PROVIDER_SITE_OTHER): Payer: Medicare HMO | Admitting: Internal Medicine

## 2013-09-12 ENCOUNTER — Encounter: Payer: Self-pay | Admitting: Internal Medicine

## 2013-09-12 VITALS — BP 112/64 | HR 101 | Temp 98.3°F | Wt 147.1 lb

## 2013-09-12 DIAGNOSIS — M79609 Pain in unspecified limb: Secondary | ICD-10-CM

## 2013-09-12 DIAGNOSIS — M79674 Pain in right toe(s): Secondary | ICD-10-CM

## 2013-09-12 DIAGNOSIS — T148XXA Other injury of unspecified body region, initial encounter: Secondary | ICD-10-CM

## 2013-09-12 MED ORDER — AMOXICILLIN-POT CLAVULANATE 500-125 MG PO TABS: 1.0000 | ORAL_TABLET | Freq: Three times a day (TID) | ORAL | Status: DC

## 2013-09-12 NOTE — Patient Instructions (Signed)
Dip gauze in  sterile saline and applied to the wound twice a day. Cover any open wound with Telfa , non stick dressing  without any antibiotic ointment. The saline can be purchased at the drugstore or you can make your own .Boil cup of salt in a gallon of water. Store mixture  in a clean container.Report Warning  signs as discussed (red streaks, pus, fever, increasing pain).

## 2013-09-12 NOTE — Progress Notes (Signed)
   Subjective:    Patient ID: Diana Lambert, female    DOB: 08-30-1945, 68 y.o.   MRN: [id number]  HPI   She had blunt trauma to the right great toe on 2 occasions 8/16 and 8/27. Specifically she stubbed the toe initially and then the second time dropped a hedge trimmer on the toe. She did buddy tape the first and second toes for a period of time. 8/27 she developed blisters at the base of the toe. She thought it was poison ivy. She popped these lesions with a sterile needle; subsequently a hypopigmented area developed. There was residual bruising from the 2 episodes of trauma. She also noted dry, scaling changes to the tissues under the nail.  She's been using Bactroban and cortisone with no significant response.    Review of Systems  She denies fever, chills, sweats, or purulent drainage  The lesion is pruritic.     Objective:   Physical Exam  Positive pertinent findings include faint erythema over the dorsum of the right great toe. This does blanch with pressure. This is not hot. There is some tenderness to palpation. Flexion does not cause pain in the toe.  There is a vitiliginous 7 by 77mm lesion over the dorsum of the foot.  Pedal pulses are equal & full..  She has pes planus  Strength, tone, deep tendon reflexes are normal in the lower extremities        Assessment & Plan:  #1 toe pain post trauma X 2 #2 rash right great toe; rule out cellulitis or osteomyelitis. Less likely would be reaction to the topical antibiotic.  Plan: See orders recommendations

## 2013-09-12 NOTE — Progress Notes (Signed)
Pre visit review using our clinic review tool, if applicable. No additional management support is needed unless otherwise documented below in the visit note. 

## 2013-10-25 ENCOUNTER — Encounter: Payer: Self-pay | Admitting: Family Medicine

## 2013-10-25 ENCOUNTER — Ambulatory Visit (INDEPENDENT_AMBULATORY_CARE_PROVIDER_SITE_OTHER): Payer: Medicare HMO | Admitting: Family Medicine

## 2013-10-25 ENCOUNTER — Other Ambulatory Visit (INDEPENDENT_AMBULATORY_CARE_PROVIDER_SITE_OTHER): Payer: Medicare HMO

## 2013-10-25 VITALS — BP 130/80 | HR 81 | Ht 65.0 in | Wt 147.0 lb

## 2013-10-25 DIAGNOSIS — S93629A Sprain of tarsometatarsal ligament of unspecified foot, initial encounter: Secondary | ICD-10-CM | POA: Insufficient documentation

## 2013-10-25 DIAGNOSIS — M79671 Pain in right foot: Secondary | ICD-10-CM

## 2013-10-25 DIAGNOSIS — S62639A Displaced fracture of distal phalanx of unspecified finger, initial encounter for closed fracture: Secondary | ICD-10-CM

## 2013-10-25 DIAGNOSIS — S93621A Sprain of tarsometatarsal ligament of right foot, initial encounter: Secondary | ICD-10-CM

## 2013-10-25 MED ORDER — VITAMIN D (ERGOCALCIFEROL) 1.25 MG (50000 UNIT) PO CAPS: 50000.0000 [IU] | ORAL_CAPSULE | ORAL | Status: DC

## 2013-10-25 NOTE — Assessment & Plan Note (Signed)
Patient is showing unfortunately no increase in healing at this time. Discussed with patient and we are going to increase her vitamin D supplementation to 50,000 weekly for the next 12 weeks. Discussed the patient is to wear the shoes with a rocker-bottom of the shoe. We discussed an icing regimen and topical anti-inflammatories were given. We discussed about other over-the-counter medications that could be beneficial. Discussed with patient what activities to avoid and patient will come back again in 3 weeks for further evaluation.

## 2013-10-25 NOTE — Progress Notes (Signed)
  [name] [address] [place], [place] [address] Phone: [phone] Subjective:    I'm seeing this patient by the request  of:  Unice Cobble, MD   CC: toe fracture.   [id number] Diana Lambert is a 68 y.o. female coming in with complaint of continued toe pain. Patient does have a fracture greater than one month ago of her toe. Patient states unfortunately continues to different difficulty. Patient has been wearing certain shoes that have been more of an official. Patient states that there is redness to the toe and the rest of her foot can hurt from time to time as well. Patient denies any numbness or tingling but does state at the end of a long day she does have some swelling on the foot. Patient's past medical history is significant for osteopenia as well as vitamin D deficiency. Patient is taking vitamin D supplementation sparingly.    x-rays reviewed by me today. Patient's x-rays show that she does have a comminuted fracture at the base of the right first distal phalanx that is intra-articular but involved less than 20% of the joint space.  Past medical history, social, surgical and family history all reviewed in electronic medical record.   Review of Systems: No headache, visual changes, nausea, vomiting, diarrhea, constipation, dizziness, abdominal pain, skin rash, fevers, chills, night sweats, weight loss, swollen lymph nodes, body aches, joint swelling, muscle aches, chest pain, shortness of breath, mood changes.   Objective Blood pressure 130/80, pulse 81, height 5\' 5"  (1.651 m), weight 147 lb (66.679 kg), SpO2 97.00%.  General: No apparent distress alert and oriented x3 mood and affect normal, dressed appropriately.  HEENT: Pupils equal, extraocular movements intact  Respiratory: Patient's speak in full sentences and does not appear short of breath  Cardiovascular: No lower extremity edema, non tender, no erythema  Skin: Warm dry intact with no  signs of infection or rash on extremities or on axial skeleton.  Abdomen: Soft nontender  Neuro: Cranial nerves II through XII are intact, neurovascularly intact in all extremities with 2+ DTRs and 2+ pulses.  Lymph: No lymphadenopathy of posterior or anterior cervical chain or axillae bilaterally.  Gait normal with good balance and coordination.  MSK:  Non tender with full range of motion and good stability and symmetric strength and tone of shoulders, elbows, wrist, hip, knee and ankles bilaterally. Mild osteophytic changes in multiple joints. Exam shows the patient does have hallux rigidus bilaterally significantly worse on the right foot. Patient states that minimally tender over the inner phalangeal joint of the first toe. Patient is also tender unfortunately over the midfoot joint. Patient has good range of motion of the ankle. Neurovascularly intact distally.  Limited musculoskeletal ultrasound was performed and interpreted by Hulan Saas, M today. Limited ultrasound shows the patient still has a delayed healing of the interphalangeal fracture that was previously seen on x-ray back on September 12, 2013.  The patient also has unfortunately a Lisfranc sprain with hypoechoic changes and increasing Doppler flow especially over the second and third intermetatarsal space. No positive Fleck sign seen.  Impression: Delayed healing of the interphalangeal fracture at the base of the right first distal phalanx as well as Lisfranc sprain   Impression and Recommendations:     This case required medical decision making of moderate complexity.

## 2013-10-25 NOTE — Patient Instructions (Addendum)
Good to see you Ice 20 minutes 2 times daily Go shoe shopping!!! Exercises 3 times a week.  Turmeric 500mg  twice daily Vitamin D2000 IU dialy and 500000 weekly.  Only in rigid sole shoe with rocker bottom.  Come back in 3 weeks and we will check again.

## 2013-10-25 NOTE — Assessment & Plan Note (Signed)
Patient does have what appears to be a Lisfranc sprain overall. We discussed an icing regimen and avoiding patient moving too much. We discussed the possibility of immobilization as well as nonweightbearing but secondary to patient's osteopenia I am concerned that could cause more concern. Patient is angulated and fine at this time as long as she wears the rocker-bottom rigid sole shoes. With this and will follow up with me again in 3 weeks.

## 2013-10-29 ENCOUNTER — Telehealth: Payer: Self-pay | Admitting: Internal Medicine

## 2013-10-29 NOTE — Telephone Encounter (Signed)
Lmovm for pt to try taking an iron tablet and/or compression stockings.

## 2013-10-29 NOTE — Telephone Encounter (Signed)
Pt called in and she is out of town.  She said that she is wearing her boot thoughout the day an takes it off at night.  She said that she is have horrible craps and it is keeping her up at night.  She would like to talk to Ria Comment about what she needs to do?     Thank you

## 2013-11-15 ENCOUNTER — Ambulatory Visit (INDEPENDENT_AMBULATORY_CARE_PROVIDER_SITE_OTHER): Payer: Medicare HMO | Admitting: Family Medicine

## 2013-11-15 ENCOUNTER — Encounter: Payer: Self-pay | Admitting: Family Medicine

## 2013-11-15 ENCOUNTER — Other Ambulatory Visit (INDEPENDENT_AMBULATORY_CARE_PROVIDER_SITE_OTHER): Payer: Medicare HMO

## 2013-11-15 VITALS — BP 110/68 | HR 75 | Ht 65.0 in | Wt 148.0 lb

## 2013-11-15 DIAGNOSIS — S62639A Displaced fracture of distal phalanx of unspecified finger, initial encounter for closed fracture: Secondary | ICD-10-CM

## 2013-11-15 DIAGNOSIS — S93621A Sprain of tarsometatarsal ligament of right foot, initial encounter: Secondary | ICD-10-CM

## 2013-11-15 DIAGNOSIS — S92253A Displaced fracture of navicular [scaphoid] of unspecified foot, initial encounter for closed fracture: Secondary | ICD-10-CM | POA: Insufficient documentation

## 2013-11-15 DIAGNOSIS — S92251A Displaced fracture of navicular [scaphoid] of right foot, initial encounter for closed fracture: Secondary | ICD-10-CM

## 2013-11-15 NOTE — Assessment & Plan Note (Signed)
Appears to be healing.

## 2013-11-15 NOTE — Progress Notes (Signed)
  [name] [address] [place], [place] [address] Phone: [phone] Subjective:     CC: toe fracture Lisfranc sprain follow-up   [id number] Diana Lambert is a 68 y.o. female coming in with complaint of continued toe pain. Patient hadn't decided to continue to wear shoes with a nonhealing fracture. Patient was also found to have a Lisfranc fracture. Patient has some swelling still she states on the top of her foot and states that the pain is minimally better. Patient states it seems to localize more on the medial aspect of her ankle and foot now. States they can be very sore at the end of the night and does have cramping in her legs.     Past medical history, social, surgical and family history all reviewed in electronic medical record.   Review of Systems: No headache, visual changes, nausea, vomiting, diarrhea, constipation, dizziness, abdominal pain, skin rash, fevers, chills, night sweats, weight loss, swollen lymph nodes, body aches, joint swelling, muscle aches, chest pain, shortness of breath, mood changes.   Objective Blood pressure 110/68, pulse 75, height 5\' 5"  (1.651 m), weight 148 lb (67.132 kg), SpO2 99 %.  General: No apparent distress alert and oriented x3 mood and affect normal, dressed appropriately.  HEENT: Pupils equal, extraocular movements intact  Respiratory: Patient's speak in full sentences and does not appear short of breath  Cardiovascular: No lower extremity edema, non tender, no erythema  Skin: Warm dry intact with no signs of infection or rash on extremities or on axial skeleton.  Abdomen: Soft nontender  Neuro: Cranial nerves II through XII are intact, neurovascularly intact in all extremities with 2+ DTRs and 2+ pulses.  Lymph: No lymphadenopathy of posterior or anterior cervical chain or axillae bilaterally.  Gait normal with good balance and coordination.  MSK:  Non tender with full range of motion and good stability  and symmetric strength and tone of shoulders, elbows, wrist, hip, knee and ankles bilaterally. Mild osteophytic changes in multiple joints. Exam shows the patient does have hallux rigidus bilaterally significantly worse on the right foot.nontender over the interphalangeal joints refracture was. Patient was severely tender over the navicular bone as well as to palpation and percussion.  Limited musculoskeletal ultrasound was performed and interpreted by Hulan Saas, M today.   Patient's Lisfranc injury appears to be healing the patient does have new findings of significant hypoechoic changes at the insertion of the posterior tib on the navicular bone and does have significant changes of the navicular bone that likely secondary to a stress reaction with a very small cortical defect noted. Increasing Doppler blood flow noted.  Impression: improved healing of the first metatarsal fracture that was seen previously but new stress reaction navicular bone   Impression and Recommendations:     This case required medical decision making of moderate complexity.

## 2013-11-15 NOTE — Assessment & Plan Note (Signed)
Healing at this time Patient has made some improvement and has not been as painful in this area. Patient likely will heal well but does have a very large bunion will also be contribute to some of her discomfort.

## 2013-11-15 NOTE — Assessment & Plan Note (Signed)
Moderate stress rxn, mild cortical defect.  Patient was put in a Cam Walker today and we discussed that there is a likelihood she'll needed for 6 weeks. Encourage her to start vitamin D supplementation as well. Patient is going to continue with the weekly vitamin D supplementation. We discussed continuing the icing. Discussed the importance of staying in the boot. Patient will come back in 3 weeks for further evaluation. At that point we may be able to start transitioning her into a shoe at home only.

## 2013-11-15 NOTE — Patient Instructions (Addendum)
Good to see you Ice is your friend and still at night. 20 minutes would be great.  Only biking and swimming.  Wear boot with walking no matter what the next 3 weeks Come in in 3 weeks and we will check. Get xray 30 minutes before you see me.

## 2013-11-28 ENCOUNTER — Encounter: Payer: Self-pay | Admitting: Family Medicine

## 2013-12-04 ENCOUNTER — Ambulatory Visit (INDEPENDENT_AMBULATORY_CARE_PROVIDER_SITE_OTHER)
Admission: RE | Admit: 2013-12-04 | Discharge: 2013-12-04 | Disposition: A | Discharge status: Home / Self Care | Payer: Medicare HMO | Source: Ambulatory Visit | Attending: Family Medicine | Admitting: Family Medicine

## 2013-12-04 DIAGNOSIS — S93621A Sprain of tarsometatarsal ligament of right foot, initial encounter: Secondary | ICD-10-CM

## 2013-12-04 DIAGNOSIS — S62639A Displaced fracture of distal phalanx of unspecified finger, initial encounter for closed fracture: Secondary | ICD-10-CM

## 2013-12-05 ENCOUNTER — Encounter: Payer: Self-pay | Admitting: Family Medicine

## 2013-12-05 ENCOUNTER — Ambulatory Visit (INDEPENDENT_AMBULATORY_CARE_PROVIDER_SITE_OTHER): Payer: Medicare HMO | Admitting: Family Medicine

## 2013-12-05 VITALS — BP 130/80 | HR 77 | Ht 65.0 in | Wt 148.0 lb

## 2013-12-05 DIAGNOSIS — S92251G Displaced fracture of navicular [scaphoid] of right foot, subsequent encounter for fracture with delayed healing: Secondary | ICD-10-CM

## 2013-12-05 DIAGNOSIS — S62639G Displaced fracture of distal phalanx of unspecified finger, subsequent encounter for fracture with delayed healing: Secondary | ICD-10-CM

## 2013-12-05 NOTE — Progress Notes (Signed)
  [facility] [address] [place], [place] [address] Phone: [phone] Subjective:     CC: toe fracture Lisfranc sprain follow-up   [id number] Diana Lambert is a 68 y.o. female coming in with complaint of continued toe pain. In a cam walker for the last 3 weeks. Patient states that the pain is better overall. Patient denies any new symptoms. States that it still hurts at the end of a long day and more of a throbbing sensation.  Patient did have repeat x-rays. These were reviewed by me and does show that patient is healing but still delayed.     Past medical history, social, surgical and family history all reviewed in electronic medical record.   Review of Systems: No headache, visual changes, nausea, vomiting, diarrhea, constipation, dizziness, abdominal pain, skin rash, fevers, chills, night sweats, weight loss, swollen lymph nodes, body aches, joint swelling, muscle aches, chest pain, shortness of breath, mood changes.   Objective Blood pressure 130/80, pulse 77, height 5\' 5"  (1.651 m), weight 148 lb (67.132 kg), SpO2 98 %.  General: No apparent distress alert and oriented x3 mood and affect normal, dressed appropriately.  HEENT: Pupils equal, extraocular movements intact  Respiratory: Patient's speak in full sentences and does not appear short of breath  Cardiovascular: No lower extremity edema, non tender, no erythema  Skin: Warm dry intact with no signs of infection or rash on extremities or on axial skeleton.  Abdomen: Soft nontender  Neuro: Cranial nerves II through XII are intact, neurovascularly intact in all extremities with 2+ DTRs and 2+ pulses.  Lymph: No lymphadenopathy of posterior or anterior cervical chain or axillae bilaterally.  Gait normal with good balance and coordination.  MSK:  Non tender with full range of motion and good stability and symmetric strength and tone of shoulders, elbows, wrist, hip, knee and ankles bilaterally.  Mild osteophytic changes in multiple joints. Exam shows the patient does have hallux rigidus bilaterally significantly worse on the right foot.nontender over the interphalangeal joints refracture was. Patient was severely tender over the navicular bone as well as to palpation and percussion.  Limited musculoskeletal ultrasound was performed and interpreted by Hulan Saas, M today.   Callus formation noted over the navicular bone as well as the distal phalanx was seen previously. Mild hypoechoic changes still over both fracture areas.  Impression: Improvement of both fractures but still would consider delayed healing. Impression and Recommendations:     This case required medical decision making of moderate complexity.

## 2013-12-05 NOTE — Assessment & Plan Note (Signed)
Patient is healing but still slowly. Encourage her to wear the Cam Walker for another 2 weeks and then will transition into shoes. Continue the high dose vitamin D supplementation. Patient does have tramadol for breakthrough pain. Patient and will come back and see me again in 3 weeks for further evaluation and treatment.

## 2013-12-05 NOTE — Assessment & Plan Note (Signed)
Patient appears to be a most completely healed at this time. Encourage her to wear the Cam Walker though for another 2 weeks secondary to the navicular bone. We discussed icing regimen. Patient and will transition to shoes for one week. At that time if continuing to do well patient will come back and we will reevaluate.

## 2013-12-05 NOTE — Patient Instructions (Signed)
Good to see you Turmeric 500mg  twice daily.  Continue the iron .  Tylenol 650 mg 3 times daily as needed.  Continue the boot for 2 more weeks then transition into a shoe.  See me again in 3 weeks.  Happy [place] day!!!!

## 2013-12-11 ENCOUNTER — Other Ambulatory Visit: Payer: Self-pay | Admitting: Internal Medicine

## 2013-12-26 ENCOUNTER — Ambulatory Visit (INDEPENDENT_AMBULATORY_CARE_PROVIDER_SITE_OTHER): Payer: Medicare HMO | Admitting: Family Medicine

## 2013-12-26 ENCOUNTER — Encounter: Payer: Self-pay | Admitting: Family Medicine

## 2013-12-26 VITALS — BP 122/78 | HR 74 | Wt 150.0 lb

## 2013-12-26 DIAGNOSIS — S92251G Displaced fracture of navicular [scaphoid] of right foot, subsequent encounter for fracture with delayed healing: Secondary | ICD-10-CM

## 2013-12-26 MED ORDER — GABAPENTIN 100 MG PO CAPS: 100.0000 mg | ORAL_CAPSULE | Freq: Every day | ORAL | Status: DC

## 2013-12-26 NOTE — Patient Instructions (Addendum)
Good to see you Ice still at the end of activity Keep biking, swimming and piliates.  Wear boot only with really hard surfaces.  Continue the pennsaid  Continue the vitamin D.  Iron down to 3 times a week.  Gabapentin 100mg  at night.  See you soon and we will make orthotics.

## 2013-12-26 NOTE — Assessment & Plan Note (Signed)
Patient is making improvement, continues to have some pain. We discussed with patient that with [place] not making patient nonweightbearing that it will slow the healing. Patient will continue with the vitamin D, home exercises, and icing protocol. We discussed wearing the Cam Walker boot when doing a lot of walking. Discussed increasing the range of motion. Patient and will come back in 3 weeks. Hopefully at that time she'll continue to improve and patient may be a candidate for custom orthotics. We'll discuss after evaluation at follow-up.  Spent greater than 25 minutes with patient face-to-face and had greater than 50% of counseling including as described above in assessment and plan.

## 2013-12-26 NOTE — Progress Notes (Signed)
  [name] [address] [place], [place] [address] Phone: [phone] Subjective:     CC: toe fracture Lisfranc sprain follow-up   [id number] Diana Lambert is a 68 y.o. female coming in with complaint of continued toe pain. In a cam walker  And now pain on th einner part of the foot. Patient has transitioned into shoes recently and has been doing somewhat better. Still notices some discomfort after walking a long amount of time. No more pain over the midfoot but still having pain over the navicular bone. No pain over the toe. Overall patient think she is continuing to improve slowly.      Past medical history, social, surgical and family history all reviewed in electronic medical record.   Review of Systems: No headache, visual changes, nausea, vomiting, diarrhea, constipation, dizziness, abdominal pain, skin rash, fevers, chills, night sweats, weight loss, swollen lymph nodes, body aches, joint swelling, muscle aches, chest pain, shortness of breath, mood changes.   Objective Blood pressure 122/78, pulse 74, weight 150 lb (68.04 kg), SpO2 98 %.  General: No apparent distress alert and oriented x3 mood and affect normal, dressed appropriately.  HEENT: Pupils equal, extraocular movements intact  Respiratory: Patient's speak in full sentences and does not appear short of breath  Cardiovascular: No lower extremity edema, non tender, no erythema  Skin: Warm dry intact with no signs of infection or rash on extremities or on axial skeleton.  Abdomen: Soft nontender  Neuro: Cranial nerves II through XII are intact, neurovascularly intact in all extremities with 2+ DTRs and 2+ pulses.  Lymph: No lymphadenopathy of posterior or anterior cervical chain or axillae bilaterally.  Gait normal with good balance and coordination.  MSK:  Non tender with full range of motion and good stability and symmetric strength and tone of shoulders, elbows, wrist, hip, knee and  ankles bilaterally. Mild osteophytic changes in multiple joints. Exam shows the patient does have hallux rigidus bilaterally significantly worse on the right foot.nontender over the interphalangeal joints refracture was. Patient is not as tender to palpation over the navicular bone but unfortunately does have pain still to percussion.  Limited musculoskeletal ultrasound was performed and interpreted by Hulan Saas, M today.   Callus formation noted over the navicular bone but continued bone deformity.  Impression: Improvement continues in the hypoechoic changes but still some bone changes noted over the navicular.  Impression and Recommendations:     This case required medical decision making of moderate complexity.

## 2014-01-02 ENCOUNTER — Encounter: Payer: Self-pay | Admitting: Family Medicine

## 2014-01-14 ENCOUNTER — Ambulatory Visit (INDEPENDENT_AMBULATORY_CARE_PROVIDER_SITE_OTHER): Payer: Medicare HMO | Admitting: Family Medicine

## 2014-01-14 ENCOUNTER — Encounter: Payer: Self-pay | Admitting: Family Medicine

## 2014-01-14 VITALS — BP 114/66 | HR 76 | Ht 65.0 in | Wt 150.0 lb

## 2014-01-14 DIAGNOSIS — S92251G Displaced fracture of navicular [scaphoid] of right foot, subsequent encounter for fracture with delayed healing: Secondary | ICD-10-CM

## 2014-01-14 NOTE — Progress Notes (Signed)
  [facility] [address] [place], [place] [address] Phone: [phone] Subjective:     CC: toe fracture Lisfranc sprain follow-up   [name] Diana Lambert is a 69 y.o. female coming in with complaint of continued toe pain.  Still notices some discomfort after walking a long amount of time. Really only having pain at night. Not stopping her from any activities. States that the gabapentin 100 mg at night seems to be helpful.  Patient states that no new symptoms. Patient would like to wear shoes and a regular basis but finds it difficult to wear the same shoe even throughout the course of the same day.      Past medical history, social, surgical and family history all reviewed in electronic medical record.   Review of Systems: No headache, visual changes, nausea, vomiting, diarrhea, constipation, dizziness, abdominal pain, skin rash, fevers, chills, night sweats, weight loss, swollen lymph nodes, body aches, joint swelling, muscle aches, chest pain, shortness of breath, mood changes.   Objective Blood pressure 114/66, pulse 76, height 5\' 5"  (1.651 m), weight 150 lb (68.04 kg), SpO2 97 %.  General: No apparent distress alert and oriented x3 mood and affect normal, dressed appropriately.  HEENT: Pupils equal, extraocular movements intact  Respiratory: Patient's speak in full sentences and does not appear short of breath  Cardiovascular: No lower extremity edema, non tender, no erythema  Skin: Warm dry intact with no signs of infection or rash on extremities or on axial skeleton.  Abdomen: Soft nontender  Neuro: Cranial nerves II through XII are intact, neurovascularly intact in all extremities with 2+ DTRs and 2+ pulses.  Lymph: No lymphadenopathy of posterior or anterior cervical chain or axillae bilaterally.  Gait normal with good balance and coordination.  MSK:  Non tender with full range of motion and good stability and symmetric strength and tone of  shoulders, elbows, wrist, hip, knee and ankles bilaterally. Mild osteophytic changes in multiple joints. Exam shows the patient does have hallux rigidus bilaterally significantly worse on the right foot.nontender over the interphalangeal joints refracture was. Patient is not as tender to palpation over the navicular bone but unfortunately does have pain still to percussion.   Patient was fitted for a : standard, cushioned, semi-rigid orthotic. The orthotic was heated and afterward the patient stood on the orthotic blank positioned on the orthotic stand. The patient was positioned in subtalar neutral position and 10 degrees of ankle dorsiflexion in a weight bearing stance. After completion of molding, a stable base was applied to the orthotic blank. The blank was ground to a stable position for weight bearing. Size:8 Base: [facility] [place] and [place]: None The patient ambulated these, and they were very comfortable.  I spent 45 minutes with this patient, greater than 50% was face-to-face time counseling regarding the below diagnosis.   Impression and Recommendations:

## 2014-01-14 NOTE — Patient Instructions (Signed)
Very good to see you Happy New Year! Vitamin D daily still would be good With orthotics wear them for 2 hours today and then increase 1  Hour daily until full day.  Ice is still your friend Call if having any trouble with the orthotics.  See me again in 4-6 weeks.

## 2014-01-14 NOTE — Assessment & Plan Note (Signed)
Patient is doing significantly better at this time. Patient has callus formation last time on ultrasound and will continue to improve. Patient was putting custom orthotics today to avoid any significant impact in this area. Patient can transition into shoes completely at this time. We discussed if any need to change the orthotic she will come back. We discussed titrating up on time within the orthotic slowly. Patient will make these changes and come back and see me again in 4-6 weeks for further evaluation.

## 2014-01-16 ENCOUNTER — Encounter: Payer: Self-pay | Admitting: Family Medicine

## 2014-01-16 ENCOUNTER — Other Ambulatory Visit: Payer: Self-pay

## 2014-01-16 DIAGNOSIS — Z1231 Encounter for screening mammogram for malignant neoplasm of breast: Secondary | ICD-10-CM

## 2014-01-24 ENCOUNTER — Encounter: Payer: Self-pay | Admitting: Family Medicine

## 2014-01-24 ENCOUNTER — Ambulatory Visit
Admission: RE | Admit: 2014-01-24 | Discharge: 2014-01-24 | Disposition: A | Discharge status: Home / Self Care | Payer: Medicare HMO | Source: Ambulatory Visit

## 2014-01-24 DIAGNOSIS — Z1231 Encounter for screening mammogram for malignant neoplasm of breast: Secondary | ICD-10-CM

## 2014-02-18 ENCOUNTER — Ambulatory Visit: Payer: Medicare HMO | Admitting: Family Medicine

## 2014-03-11 ENCOUNTER — Other Ambulatory Visit: Payer: Self-pay | Admitting: Internal Medicine

## 2014-03-11 NOTE — Telephone Encounter (Signed)
#  [phone], R X1

## 2014-03-29 ENCOUNTER — Encounter: Payer: Self-pay | Admitting: Family Medicine

## 2014-04-03 ENCOUNTER — Encounter: Payer: Self-pay | Admitting: Family Medicine

## 2014-04-24 ENCOUNTER — Ambulatory Visit (INDEPENDENT_AMBULATORY_CARE_PROVIDER_SITE_OTHER): Payer: Medicare HMO | Admitting: Family Medicine

## 2014-04-24 ENCOUNTER — Encounter: Payer: Self-pay | Admitting: Family Medicine

## 2014-04-24 VITALS — BP 124/74 | HR 75 | Ht 65.0 in | Wt 152.0 lb

## 2014-04-24 DIAGNOSIS — S93621D Sprain of tarsometatarsal ligament of right foot, subsequent encounter: Secondary | ICD-10-CM | POA: Diagnosis not present

## 2014-04-24 NOTE — Assessment & Plan Note (Signed)
Discussed with patient at this time. I feel that patient's custom orthotics her to thicken we are unable to make them any thinner. Patient will be fitted for more slowly and the orthotics in the near future. We discussed starting the icing program again as well as the home exercises. I do not think a Cam [place] Rile is necessary. Patient has any worsening symptoms we may need to consider further evaluation with ultrasound and potentially advance imaging. Patient will try the conservative approach first. Patient and will come back and see me again in 4 weeks for further evaluation and treatment.  Spent  25 minutes with patient face-to-face and had greater than 50% of counseling including as described above in assessment and plan.

## 2014-04-24 NOTE — Progress Notes (Signed)
  [name] [address], [place] [address] Phone: [phone] Subjective:     CC: toe fracture Lisfranc sprain follow-up   [id number] Diana Lambert is a 69 y.o. female coming in for follow-up of her foot pain. Patient states that both of her feet as well as the top of her feet has swelling and pain. Patient was put in custom orthotics in for a long time has been giving her difficulty. Patient did have changes to orthotics but did not notice any significant improvement. Patient states patient says she still has a dull aching pain that is mostly on the top of her foot. Patient denies that this is significantly worse but states that when she wears her custom orthotics can be very uncomfortable and does cause some swelling. Patient states that sometimes it is bad enough that stops her from activities. Denies any nighttime awakening. Still not as bad as it was previously.      Past medical history, social, surgical and family history all reviewed in electronic medical record.   Review of Systems: No headache, visual changes, nausea, vomiting, diarrhea, constipation, dizziness, abdominal pain, skin rash, fevers, chills, night sweats, weight loss, swollen lymph nodes, body aches, joint swelling, muscle aches, chest pain, shortness of breath, mood changes.   Objective Blood pressure 124/74, pulse 75, height 5\' 5"  (1.651 m), weight 152 lb (68.947 kg), SpO2 97 %.  General: No apparent distress alert and oriented x3 mood and affect normal, dressed appropriately.  HEENT: Pupils equal, extraocular movements intact  Respiratory: Patient's speak in full sentences and does not appear short of breath  Cardiovascular: No lower extremity edema, non tender, no erythema  Skin: Warm dry intact with no signs of infection or rash on extremities or on axial skeleton.  Abdomen: Soft nontender  Neuro: Cranial nerves II through XII are intact, neurovascularly intact in all  extremities with 2+ DTRs and 2+ pulses.  Lymph: No lymphadenopathy of posterior or anterior cervical chain or axillae bilaterally.  Gait normal with good balance and coordination.  MSK:  Non tender with full range of motion and good stability and symmetric strength and tone of shoulders, elbows, wrist, hip, knee and ankles bilaterally. Mild osteophytic changes in multiple joints. Exam shows the patient does have hallux rigidus bilaterally significantly worse on the right foot.nontender over the interphalangeal joints. Patient is not as tender to palpation over the navicular bone .  No significant change from previous exam except less tender. No swelling noted.   Impression and Recommendations:     This case required medical decision making of moderate complexity.

## 2014-04-24 NOTE — Progress Notes (Signed)
Pre visit review using our clinic review tool, if applicable. No additional management support is needed unless otherwise documented below in the visit note. 

## 2014-04-24 NOTE — Patient Instructions (Signed)
Good to see you Diana Lambert is your friend but only the ankle Try the pennsaid twice daily for next week then as needed Lace shoes differently to help give more room for top of foot We will get you new orthotics See me again in 4 weeks

## 2014-05-01 ENCOUNTER — Ambulatory Visit (INDEPENDENT_AMBULATORY_CARE_PROVIDER_SITE_OTHER): Payer: Medicare HMO | Admitting: Family Medicine

## 2014-05-01 ENCOUNTER — Encounter: Payer: Self-pay | Admitting: Family Medicine

## 2014-05-01 DIAGNOSIS — S93621D Sprain of tarsometatarsal ligament of right foot, subsequent encounter: Secondary | ICD-10-CM

## 2014-05-01 NOTE — Assessment & Plan Note (Signed)
This is a replacement pair. We discussed icing regimen and how to wear this slowly over the course of time. Patient won't break these in slowly and an come back and see me again in 2-4 weeks for further evaluation and treatment.

## 2014-05-01 NOTE — Progress Notes (Signed)
Patient was fitted for a : standard, cushioned, semi-rigid orthotic. The orthotic was heated and afterward the patient was in a seated position and the orthotic molded. The patient was positioned in subtalar neutral position and 10 degrees of ankle dorsiflexion in a non-weight bearing stance. After completion of molding, patient did have orthotic management which included instructions on acclimating to the orthotics, signs of ill fit as well as care for the orthotic.  I spent 32minutes fitting the orthotics, as well as discussing proper care and adjustments to other shoes and acclimating to her new orthotics.   The blank was ground to a stable position for weight bearing. Size: 8 (medi Business Slim)  Base: Research scientist (medical) and Padding: The following postings were fitted onto the molded orthotics to help maintain a talar neutral position - Wedge posting for transverse arch: None       Silicone posting for longitudinal arch: None  The patient ambulated these, and they were very comfortable and supportive.

## 2014-05-01 NOTE — Patient Instructions (Signed)

## 2014-06-04 ENCOUNTER — Encounter: Payer: Self-pay | Admitting: Family Medicine

## 2014-06-05 ENCOUNTER — Ambulatory Visit: Payer: Medicare HMO | Admitting: Internal Medicine

## 2014-06-06 ENCOUNTER — Ambulatory Visit (INDEPENDENT_AMBULATORY_CARE_PROVIDER_SITE_OTHER): Payer: Medicare HMO

## 2014-06-06 ENCOUNTER — Ambulatory Visit (INDEPENDENT_AMBULATORY_CARE_PROVIDER_SITE_OTHER)
Admission: RE | Admit: 2014-06-06 | Discharge: 2014-06-06 | Disposition: A | Discharge status: Home / Self Care | Payer: Medicare HMO | Source: Ambulatory Visit | Attending: Internal Medicine | Admitting: Internal Medicine

## 2014-06-06 ENCOUNTER — Encounter: Payer: Self-pay | Admitting: Family Medicine

## 2014-06-06 ENCOUNTER — Ambulatory Visit (INDEPENDENT_AMBULATORY_CARE_PROVIDER_SITE_OTHER): Payer: Medicare HMO | Admitting: Family Medicine

## 2014-06-06 VITALS — BP 120/78 | HR 76 | Ht 65.0 in | Wt 149.0 lb

## 2014-06-06 DIAGNOSIS — M79672 Pain in left foot: Secondary | ICD-10-CM

## 2014-06-06 DIAGNOSIS — S92302A Fracture of unspecified metatarsal bone(s), left foot, initial encounter for closed fracture: Secondary | ICD-10-CM | POA: Diagnosis not present

## 2014-06-06 DIAGNOSIS — M8580 Other specified disorders of bone density and structure, unspecified site: Secondary | ICD-10-CM

## 2014-06-06 DIAGNOSIS — M76822 Posterior tibial tendinitis, left leg: Secondary | ICD-10-CM

## 2014-06-06 DIAGNOSIS — M859 Disorder of bone density and structure, unspecified: Secondary | ICD-10-CM | POA: Diagnosis not present

## 2014-06-06 DIAGNOSIS — S92309A Fracture of unspecified metatarsal bone(s), unspecified foot, initial encounter for closed fracture: Secondary | ICD-10-CM | POA: Insufficient documentation

## 2014-06-06 MED ORDER — ALENDRONATE SODIUM 70 MG PO TABS
70.0000 mg | ORAL_TABLET | ORAL | Status: DC
Start: 2014-06-06 — End: 2014-09-26

## 2014-06-06 MED ORDER — VITAMIN D (ERGOCALCIFEROL) 1.25 MG (50000 UNIT) PO CAPS: 50000.0000 [IU] | ORAL_CAPSULE | ORAL | Status: DC

## 2014-06-06 NOTE — Progress Notes (Signed)
  [facility] [address], [place] [address] Phone: [phone] Subjective:     CC: New left foot pain  Diana Lambert is a 69 y.o. female coming in for follow-up of her foot pain. Patient actually states that her right foot feels better but she is having worsening symptoms on the left foot. Patient states that the pain feels very similar to what it was before she had the stress fracture on the other side. States that it seems to be mostly over the first toe. States that she is unable to wear the custom orthotics due to him being too rigid. Patient states that this is more of a dull aching sensation and seems to be worse with the first steps in the morning. States that it seems to wrap around the ankle and does have swelling associated with it. Patient states it has not affected her daily activities yet but she is having more pain.    Past medical history, social, surgical and family history all reviewed in electronic medical record.   Review of Systems: No headache, visual changes, nausea, vomiting, diarrhea, constipation, dizziness, abdominal pain, skin rash, fevers, chills, night sweats, weight loss, swollen lymph nodes, body aches, joint swelling, muscle aches, chest pain, shortness of breath, mood changes.   Objective Blood pressure 120/78, pulse 76, height 5\' 5"  (1.651 m), weight 149 lb (67.586 kg), SpO2 96 %.  General: No apparent distress alert and oriented x3 mood and affect normal, dressed appropriately.  HEENT: Pupils equal, extraocular movements intact  Respiratory: Patient's speak in full sentences and does not appear short of breath  Cardiovascular: No lower extremity edema, non tender, no erythema  Skin: Warm dry intact with no signs of infection or rash on extremities or on axial skeleton.  Abdomen: Soft nontender  Neuro: Cranial nerves II through XII are intact, neurovascularly intact in all extremities with 2+ DTRs and 2+  pulses.  Lymph: No lymphadenopathy of posterior or anterior cervical chain or axillae bilaterally.  Gait normal with good balance and coordination.  MSK:  Non tender with full range of motion and good stability and symmetric strength and tone of shoulders, elbows, wrist, hip, knee and ankles bilaterally. Mild osteophytic changes in multiple joints. Exam shows the patient does have hallux rigidus bilaterally significantly worse on the right foot Patient is tender to palpation of the first metatarsal on the left foot. Patient also has tenderness going around the posterior aspect of the heel malleolus. No swelling appreciated.  Limited musculoskeletal ultrasound was performed and interpreted by Hulan Saas, M  Limited ultrasound of the left first metatarsal shows the patient does have a healing callus formation just distal to the MTP joint. Increasing Doppler flow noted. In addition of this patient does have a posterior tibialis tendinitis but no true tear appreciated. Impression: Healing stress fracture of the first MTP distally and posterior tibialis tendinitis. Impression and Recommendations:     This case required medical decision making of moderate complexity.

## 2014-06-06 NOTE — Assessment & Plan Note (Signed)
She does have more of a tibialis tendinitis. We discussed icing regimen, home exercises, as well as potentially a heel lift. Patient is having increasing inflammation in the area and will try topical anti-inflammatory. Patient given home exercises and will work with her Physiological scientist. Patient will come back and see me again in 3 weeks.

## 2014-06-06 NOTE — Progress Notes (Signed)
Pre visit review using our clinic review tool, if applicable. No additional management support is needed unless otherwise documented below in the visit note. 

## 2014-06-06 NOTE — Patient Instructions (Addendum)
Good to see you Vitamin D 50000 IU daily for 8 weeks.  Fodsamax weekly for 4-8 weeks New exercises for posterior tib Ice the ankle but not the foot.  Boot maybe for the weekend Bone density test See me again in 3 weeks.    Posterior Tibial Tendon Tendinitis with Rehab Tendonitis is a condition that is characterized by inflammation of a tendon or the lining (sheath) that surrounds it. The inflammation is usually caused by damage to the tendon, such as a tendon tear (strain). Sprains are classified into three categories. Grade 1 sprains cause pain, but the tendon is not lengthened. Grade 2 sprains include a lengthened ligament due to the ligament being stretched or partially ruptured. With grade 2 sprains there is still function, although the function may be diminished. Grade 3 sprains are characterized by a complete tear of the tendon or muscle, and function is usually impaired. Posterior tibialis tendonitis is tendonitis of the posterior tibial tendon, which attaches muscles of the lower leg to the foot. The posterior tibial tendon is located in the back of the ankle and helps the body straighten (plantar flex) and rotate inward (medially rotate) the ankle. SYMPTOMS   Pain, tenderness, swelling, warmth, and/or redness over the back of the inner ankle at the posterior tibial tendon or the inner part of the mid-foot.  Pain that worsens with plantar flexion or medial rotation of the ankle.  A crackling sound (crepitation) when the tendon is moved or touched. CAUSES  Posterior tibial tendonitis occurs when damage to the posterior tibial tendon starts an inflammatory response. Common mechanisms of injury include:  Degenerative (occurs with aging) processes that weaken the tendon and make it more susceptible to injury.  Stress placed on the tendon from an increase in the intensity, frequency, or duration of training.  Direct trauma to the ankle.  Returning to activity before a previous ankle  injury is allowed to heal. RISK INCREASES WITH:  Activities that involve repetitive and/or stressful plantar flexion (jumping, kicking, or running up/down hills).  Poor strength and flexibility.  Flat feet.  Previous injury to the foot, ankle, or leg. PREVENTION   Warm up and stretch properly before activity.  Allow for adequate recovery between workouts.  Maintain physical fitness:  Strength, flexibility, and endurance.  Cardiovascular fitness.  Learn and use proper technique. When possible, have a coach correct improper technique.  Complete rehabilitation from a previous foot, ankle, or leg injury.  If you have flat feet, wear arch supports (orthotics). PROGNOSIS  If treated properly, the symptoms of tendonitis usually resolve within 6 weeks. This period may be shorter for injuries caused by direct trauma. RELATED COMPLICATIONS   Prolonged healing time, if improperly treated or reinjured.  Recurrent symptoms that result in a chronic problem.  Partial or complete tendon tear (rupture) requiring surgery. TREATMENT  Treatment initially involves the use of ice and medication to help reduce pain and inflammation. The use of strengthening and stretching exercises may help reduce pain with activity. These exercises may be performed at home or with referral to a therapist. Often times, your caregiver will recommend immobilizing the ankle to allow the tendon to heal. If you have flat feet, you may be advised to wear orthotic arch supports. If symptoms persist for greater than 6 months despite nonsurgical (conservative) treatment, then surgery may be recommended. MEDICATION   If pain medication is necessary, then nonsteroidal anti-inflammatory medications, such as aspirin and ibuprofen, or other minor pain relievers, such as acetaminophen, are often  recommended.  Do not take pain medication for 7 days before surgery.  Prescription pain relievers may be given if deemed necessary by  your caregiver. Use only as directed and only as much as you need.  Corticosteroid injections may be given by your caregiver. These injections should be reserved for the most serious cases because they may only be given a certain number of times. HEAT AND COLD  Cold treatment (icing) relieves pain and reduces inflammation. Cold treatment should be applied for 10 to 15 minutes every 2 to 3 hours for inflammation and pain and immediately after any activity that aggravates your symptoms. Use ice packs or massage the area with a piece of ice (ice massage).  Heat treatment may be used prior to performing the stretching and strengthening activities prescribed by your caregiver, physical therapist, or athletic trainer. Use a heat pack or soak the injury in warm water. SEEK MEDICAL CARE IF:  Treatment seems to offer no benefit, or the condition worsens.  Any medications produce adverse side effects. EXERCISES RANGE OF MOTION (ROM) AND STRETCHING EXERCISES - Posterior Tibial Tendon Tendinitis These exercises may help you when beginning to rehabilitate your injury. Your symptoms may resolve with or without further involvement from your physician, physical therapist or athletic trainer. While completing these exercises, remember:   Restoring tissue flexibility helps normal motion to return to the joints. This allows healthier, less painful movement and activity.  An effective stretch should be held for at least 30 seconds.  A stretch should never be painful. You should only feel a gentle lengthening or release in the stretched tissue. RANGE OF MOTION - Ankle Plantar Flexion   Sit with your right / left leg crossed over your opposite knee.  Use your opposite hand to pull the top of your foot and toes toward you.  You should feel a gentle stretch on the top of your foot/ankle. Hold this position for __________ seconds. Repeat __________ times. Complete this exercise __________ times per day.  RANGE  OF MOTION - Ankle Eversion   Sit with your right / left ankle crossed over your opposite knee.  Grip your foot with your opposite hand, placing your thumb on the top of your foot and your fingers across the bottom of your foot.  Gently push your foot downward with a slight rotation so your littlest toes rise slightly.  You should feel a gentle stretch on the inside of your ankle. Hold the stretch for __________ seconds. Repeat __________ times. Complete this exercise __________ times per day.  RANGE OF MOTION - Ankle Inversion   Sit with your right / left ankle crossed over your opposite knee.  Grip your foot with your opposite hand, placing your thumb on the bottom of your foot and your fingers across the top of your foot.  Gently pull your foot so the smallest toe comes toward you and your thumb pushes the inside of the ball of your foot away from you.  You should feel a gentle stretch on the outside of your ankle. Hold the stretch for __________ seconds. Repeat __________ times. Complete this exercise __________ times per day.  RANGE OF MOTION - Dorsi/Plantar Flexion  While sitting with your right / left knee straight, draw the top of your foot upward by flexing your ankle. Then reverse the motion, pointing your toes downward.  Hold each position for __________ seconds.  After completing your first set of exercises, repeat this exercise with your knee bent. Repeat __________ times. Complete  this exercise __________ times per day.  RANGE OF MOTION - Ankle Alphabet  Imagine your right / left big toe is a pen.  Keeping your hip and knee still, write out the entire alphabet with your "pen." Make the letters as large as you can without increasing any discomfort. Repeat __________ times. Complete this exercise __________ times per day.  STRETCH - Gastrocsoleus   Sit with your right / left leg extended. Holding onto both ends of a belt or towel, loop it around the ball of your  foot.  Keeping your right / left ankle and foot relaxed and your knee straight, pull your foot and ankle toward you using the belt/towel.  You should feel a gentle stretch behind your calf or knee. Hold this position for __________ seconds. Repeat __________ times. Complete this exercise __________ times per day.  STRETCH - Gastroc, Standing   Place hands on wall.  Extend right / left leg, keeping the front knee somewhat bent.  Slightly point your toes inward on your back foot.  Keeping your right / left heel on the floor and your knee straight, shift your weight toward the wall, not allowing your back to arch.  You should feel a gentle stretch in the right / left calf. Hold this position for __________ seconds. Repeat __________ times. Complete this stretch __________ times per day. STRETCH - Soleus, Standing   Place hands on wall.  Extend right / left leg, keeping the other knee somewhat bent.  Slightly point your toes inward on your back foot.  Keep your right / left heel on the floor, bend your back knee, and slightly shift your weight over the back leg so that you feel a gentle stretch deep in your back calf.  Hold this position for __________ seconds. Repeat __________ times. Complete this stretch __________ times per day. STRENGTHENING EXERCISES - Posterior Tibial Tendon Tendinitis These exercises may help you when beginning to rehabilitate your injury. They may resolve your symptoms with or without further involvement from your physician, physical therapist, or athletic trainer. While completing these exercises, remember:   Muscles can gain both the endurance and the strength needed for everyday activities through controlled exercises.  Complete these exercises as instructed by your physician, physical therapist, or athletic trainer. Progress the resistance and repetitions only as guided. STRENGTH - Dorsiflexors  Secure a rubber exercise band/tubing to a fixed object  (i.e., table, pole) and loop the other end around your right / left foot.  Sit on the floor facing the fixed object. The band/tubing should be slightly tense when your foot is relaxed.  Slowly draw your foot back toward you using your ankle and toes.  Hold this position for __________ seconds. Slowly release the tension in the band and return your foot to the starting position. Repeat __________ times. Complete this exercise __________ times per day.  STRENGTH - Towel Curls  Sit in a chair positioned on a non-carpeted surface.  Place your foot on a towel, keeping your heel on the floor.  Pull the towel toward your heel by only curling your toes. Keep your heel on the floor.  If instructed by your physician, physical therapist, or athletic trainer, add ____________________ at the end of the towel. Repeat __________ times. Complete this exercise __________ times per day. STRENGTH - Ankle Eversion   Secure one end of a rubber exercise band/tubing to a fixed object (table, pole). Loop the other end around your foot just before your toes.  Place your  fists between your knees. This will focus your strengthening at your ankle.  Drawing the band/tubing across your opposite foot, slowly pull your little toe out and up. Make sure the band/tubing is positioned to resist the entire motion.  Hold this position for __________ seconds.  Have your muscles resist the band/tubing as it slowly pulls your foot back to the starting position. Repeat __________ times. Complete this exercise __________ times per day.  STRENGTH - Ankle Inversion   Secure one end of a rubber exercise band/tubing to a fixed object (table, pole). Loop the other end around your foot just before your toes.  Place your fists between your knees. This will focus your strengthening at your ankle.  Slowly, pull your big toe up and in, making sure the band/tubing is positioned to resist the entire motion.  Hold this position for  __________ seconds.  Have your muscles resist the band/tubing as it slowly pulls your foot back to the starting position. Repeat __________ times. Complete this exercises __________ times per day.  Document Released: 12/28/2004 Document Revised: 05/14/2013 Document Reviewed: 04/11/2008 [facility] Patient Information 2015 [place], [place]. This information is not intended to replace advice given to you by your health care provider. Make sure you discuss any questions you have with your health care provider.

## 2014-06-06 NOTE — Assessment & Plan Note (Signed)
She does have more of a metatarsal fracture. We discussed icing regimen and home exercises. We discussed vitamin D supplementation. We discussed with patient having a second stress fracture in a calendar year that of a bone density needs to be done. Patient will make these changes and come back and see me again in 3 weeks. We will ultrasound to make sure patient is healing appropriately.

## 2014-06-27 ENCOUNTER — Encounter: Payer: Self-pay | Admitting: Family Medicine

## 2014-06-27 ENCOUNTER — Ambulatory Visit (INDEPENDENT_AMBULATORY_CARE_PROVIDER_SITE_OTHER): Payer: Medicare HMO | Admitting: Family Medicine

## 2014-06-27 VITALS — BP 110/64 | HR 68 | Ht 65.0 in | Wt 149.0 lb

## 2014-06-27 DIAGNOSIS — M76822 Posterior tibial tendinitis, left leg: Secondary | ICD-10-CM | POA: Diagnosis not present

## 2014-06-27 MED ORDER — FUROSEMIDE 20 MG PO TABS: 20.0000 mg | ORAL_TABLET | Freq: Every day | ORAL | Status: DC

## 2014-06-27 NOTE — Progress Notes (Signed)
Pre visit review using our clinic review tool, if applicable. No additional management support is needed unless otherwise documented below in the visit note. 

## 2014-06-27 NOTE — Patient Instructions (Addendum)
So good to see you Your new bone scan was great! Foot is doing great For the swelling We will give a baby dose of lasix take for 3-5 days then as needed Iron 325mg  (65mg  elemental) daily or 3 times a week if constipation.  Continue the gabapentin until 2 weeks of iron.  Go bed shopping!!! See me again in 3 weeks.

## 2014-06-27 NOTE — Assessment & Plan Note (Signed)
Seems to be doing relatively well at this time. Discussed icing regimen and home exercises. Discussed continuing the different proper shoe alignment. Patient well continue with the vitamin D supplementation. Patient come back and see me again in 3-4 weeks. At that time on inspection to be pain-free. For the swelling patient was given a very small dose of a diabetic to see if this will be helpful.

## 2014-06-27 NOTE — Progress Notes (Signed)
  [name] [place], [place] [address] Phone: [phone] Subjective:     CC: New left foot pain  Diana Lambert is a 69 y.o. female coming in for follow-up of her foot pain. Patient was having left foot pain. Seem to have more of a stress reaction over the first metatarsal as well as a posterior tib tendinitis. Patient was sent for a bone DEXA scan with this being the second stress fracture in the course of the last 12 months. Patient's DEXA scan showing actually improvement from 12 years ago. Patient states overall the pain is significantly improved. Patient is more concerned because of the small amount of swelling that is still noted. Patient never did take the Fosamax. Patient did take the high-dose vitamin D and we'll do for 2 weeks and then once to have it checked. Patient otherwise is feeling much better.    Past medical history, social, surgical and family history all reviewed in electronic medical record.   Review of Systems: No headache, visual changes, nausea, vomiting, diarrhea, constipation, dizziness, abdominal pain, skin rash, fevers, chills, night sweats, weight loss, swollen lymph nodes, body aches, joint swelling, muscle aches, chest pain, shortness of breath, mood changes.   Objective Blood pressure 110/64, pulse 68, height 5\' 5"  (1.651 m), weight 149 lb (67.586 kg), SpO2 97 %.  General: No apparent distress alert and oriented x3 mood and affect normal, dressed appropriately.  HEENT: Pupils equal, extraocular movements intact  Respiratory: Patient's speak in full sentences and does not appear short of breath  Cardiovascular: No lower extremity edema, non tender, no erythema  Skin: Warm dry intact with no signs of infection or rash on extremities or on axial skeleton.  Abdomen: Soft nontender  Neuro: Cranial nerves II through XII are intact, neurovascularly intact in all extremities with 2+ DTRs and 2+ pulses.    Lymph: No lymphadenopathy of posterior or anterior cervical chain or axillae bilaterally.  Gait normal with good balance and coordination.  MSK:  Non tender with full range of motion and good stability and symmetric strength and tone of shoulders, elbows, wrist, hip, knee and ankles bilaterally. Mild osteophytic changes in multiple joints. Exam shows the patient does have hallux rigidus bilaterally significantly worse on the right foot Patient is nontender on exam. Patient does have some very mild dorsal swelling of the foot compared to the contralateral side. Full range of motion of the ankle. Mild tightness over the posterior tibialis but no true pain.   Impression and Recommendations:     This case required medical decision making of moderate complexity.

## 2014-07-08 ENCOUNTER — Other Ambulatory Visit: Payer: Self-pay

## 2014-07-18 ENCOUNTER — Ambulatory Visit: Payer: Medicare HMO | Admitting: Family Medicine

## 2014-07-22 ENCOUNTER — Other Ambulatory Visit: Payer: Self-pay | Admitting: Emergency Medicine

## 2014-07-22 MED ORDER — CELECOXIB 100 MG PO CAPS: 100.0000 mg | ORAL_CAPSULE | Freq: Two times a day (BID) | ORAL | Status: DC

## 2014-09-03 ENCOUNTER — Other Ambulatory Visit: Payer: Self-pay | Admitting: Internal Medicine

## 2014-09-03 NOTE — Telephone Encounter (Signed)
Last seen 9/15. Please advise

## 2014-09-03 NOTE — Telephone Encounter (Signed)
#  30 will need OV for refills

## 2014-09-04 ENCOUNTER — Other Ambulatory Visit: Payer: Self-pay | Admitting: Emergency Medicine

## 2014-09-04 MED ORDER — CITALOPRAM HYDROBROMIDE 40 MG PO TABS: 20.0000 mg | ORAL_TABLET | Freq: Every day | ORAL | Status: DC

## 2014-09-25 ENCOUNTER — Other Ambulatory Visit: Payer: Self-pay | Admitting: Internal Medicine

## 2014-09-25 ENCOUNTER — Encounter: Payer: Self-pay | Admitting: Internal Medicine

## 2014-09-25 DIAGNOSIS — E559 Vitamin D deficiency, unspecified: Secondary | ICD-10-CM

## 2014-09-25 DIAGNOSIS — E785 Hyperlipidemia, unspecified: Secondary | ICD-10-CM

## 2014-09-25 DIAGNOSIS — Z85828 Personal history of other malignant neoplasm of skin: Secondary | ICD-10-CM

## 2014-09-25 DIAGNOSIS — M8580 Other specified disorders of bone density and structure, unspecified site: Secondary | ICD-10-CM

## 2014-09-26 ENCOUNTER — Ambulatory Visit (INDEPENDENT_AMBULATORY_CARE_PROVIDER_SITE_OTHER): Payer: Medicare HMO | Admitting: Internal Medicine

## 2014-09-26 ENCOUNTER — Encounter: Payer: Self-pay | Admitting: Internal Medicine

## 2014-09-26 VITALS — BP 124/74 | HR 64 | Temp 97.9°F | Resp 16 | Ht 63.75 in | Wt 148.0 lb

## 2014-09-26 DIAGNOSIS — G2581 Restless legs syndrome: Secondary | ICD-10-CM

## 2014-09-26 DIAGNOSIS — Z85828 Personal history of other malignant neoplasm of skin: Secondary | ICD-10-CM

## 2014-09-26 DIAGNOSIS — E785 Hyperlipidemia, unspecified: Secondary | ICD-10-CM

## 2014-09-26 DIAGNOSIS — E559 Vitamin D deficiency, unspecified: Secondary | ICD-10-CM

## 2014-09-26 DIAGNOSIS — Z23 Encounter for immunization: Secondary | ICD-10-CM | POA: Diagnosis not present

## 2014-09-26 MED ORDER — GABAPENTIN 100 MG PO CAPS
100.0000 mg | ORAL_CAPSULE | Freq: Every day | ORAL | Status: AC
Start: 2014-09-26 — End: ?

## 2014-09-26 NOTE — Progress Notes (Signed)
   Subjective:    Patient ID: Diana Lambert, female    DOB: 1945-06-02, 69 y.o.   MRN: [id number]  HPI The patient is here to assess status of active health conditions.  PMH, FH, & Social History reviewed & updated.No change in [place] as recorded.  She is on a heart healthy diet. She exercises in Pilates once a week. Exercise is limited by her active foot issues. This is been associated with some edema of the feet but not the ankles.  She has restless leg syndrome; she wants to restart the gabapentin which was of benefit.  She has visual auras but no treatment was recommended by Dr. Bing Plume.  She does have some heat intolerance.  Colonoscopy is up-to-date (2012) ;she has no active GI symptoms.  Review of Systems Chest pain, palpitations, tachycardia, exertional dyspnea, paroxysmal nocturnal dyspnea, claudication or edema are absent. No unexplained weight loss, abdominal pain, significant dyspepsia, dysphagia, melena, rectal bleeding, or persistently small caliber stools. Dysuria, pyuria, hematuria, frequency, nocturia or polyuria are denied. Change in hair, skin, nails denied. No bowel changes of constipation or diarrhea. No intolerance to cold.     Objective:   Physical Exam  Pertinent or positive findings include:Minor crepitus of knees.  General appearance :adequately nourished; in no distress.  Eyes: No conjunctival inflammation or scleral icterus is present.  Oral exam:  Lips and gums are healthy appearing.There is no oropharyngeal erythema or exudate noted. Dental hygiene is good.  Heart:  Normal rate and regular rhythm. S1 and S2 normal without gallop, murmur, click, rub or other extra sounds    Lungs:Chest clear to auscultation; no wheezes, rhonchi,rales ,or rubs present.No increased work of breathing.   Abdomen: bowel sounds normal, soft and non-tender without masses, organomegaly or hernias noted.  No guarding or rebound.   Vascular : all pulses equal ; no bruits  present.  Skin:Warm & dry.  Intact without suspicious lesions or rashes ; no tenting or jaundice   Lymphatic: No lymphadenopathy is noted about the head, neck, axilla.   Neuro: Strength, tone & DTRs normal.         Assessment & Plan:  See Current Assessment & Plan in Problem List under specific Diagnosis

## 2014-09-26 NOTE — Assessment & Plan Note (Signed)
gabapentin one qhs as needed.

## 2014-09-26 NOTE — Progress Notes (Signed)
Pre visit review using our clinic review tool, if applicable. No additional management support is needed unless otherwise documented below in the visit note. 

## 2014-09-26 NOTE — Patient Instructions (Signed)
  Your next office appointment will be determined based upon review of your pending labs. Those written interpretation of the lab results and instructions will be transmitted to you by My Chart Critical results will be called. Followup as needed for any active or acute issue. Please report any significant change in your symptoms.  Assess response to the gabapentin one @ bedtime as needed. If it is partially beneficial, it can be increased up to a total of 3 pills  as needed. This increase of 1 pill   should take place over 72 hours at least.If 300 mg is effective dose ; there is a 300 mg pill.

## 2014-09-27 ENCOUNTER — Other Ambulatory Visit (INDEPENDENT_AMBULATORY_CARE_PROVIDER_SITE_OTHER): Payer: Medicare HMO

## 2014-09-27 DIAGNOSIS — E559 Vitamin D deficiency, unspecified: Secondary | ICD-10-CM | POA: Diagnosis not present

## 2014-09-27 DIAGNOSIS — E785 Hyperlipidemia, unspecified: Secondary | ICD-10-CM

## 2014-09-27 DIAGNOSIS — Z85828 Personal history of other malignant neoplasm of skin: Secondary | ICD-10-CM | POA: Diagnosis not present

## 2014-09-27 DIAGNOSIS — M8580 Other specified disorders of bone density and structure, unspecified site: Secondary | ICD-10-CM | POA: Diagnosis not present

## 2014-09-27 LAB — LIPID PANEL
CHOLESTEROL: 175 mg/dL (ref 0–200)
HDL: 81.8 mg/dL (ref >39.00)
LDL Cholesterol: 82 mg/dL (ref 0–99)
NONHDL: 93.37
TRIGLYCERIDES: 58 mg/dL (ref 0.0–149.0)
Total CHOL/HDL Ratio: 2
VLDL: 11.6 mg/dL (ref 0.0–40.0)

## 2014-09-27 LAB — BASIC METABOLIC PANEL
BUN: 15 mg/dL (ref 6–23)
CALCIUM: 9.2 mg/dL (ref 8.4–10.5)
CO2: 28 meq/L (ref 19–32)
CREATININE: 0.79 mg/dL (ref 0.40–1.20)
Chloride: 104 mEq/L (ref 96–112)
GFR: 76.1 mL/min (ref >60.00)
GLUCOSE: 87 mg/dL (ref 70–99)
Potassium: 4.6 mEq/L (ref 3.5–5.1)
SODIUM: 140 meq/L (ref 135–145)

## 2014-09-27 LAB — HEPATIC FUNCTION PANEL
ALBUMIN: 4.1 g/dL (ref 3.5–5.2)
ALK PHOS: 61 U/L (ref 39–117)
ALT: 12 U/L (ref 0–35)
AST: 15 U/L (ref 0–37)
Bilirubin, Direct: 0.1 mg/dL (ref 0.0–0.3)
Total Bilirubin: 0.5 mg/dL (ref 0.2–1.2)
Total Protein: 6.5 g/dL (ref 6.0–8.3)

## 2014-09-27 LAB — CBC WITH DIFFERENTIAL/PLATELET
BASOS ABS: 0 10*3/uL (ref 0.0–0.1)
BASOS PCT: 0.3 % (ref 0.0–3.0)
Eosinophils Absolute: 0.1 10*3/uL (ref 0.0–0.7)
Eosinophils Relative: 1.6 % (ref 0.0–5.0)
HCT: 40.2 % (ref 36.0–46.0)
Hemoglobin: 13.6 g/dL (ref 12.0–15.0)
LYMPHS ABS: 1.3 10*3/uL (ref 0.7–4.0)
Lymphocytes Relative: 23.7 % (ref 12.0–46.0)
MCHC: 33.9 g/dL (ref 30.0–36.0)
MCV: 93.6 fl (ref 78.0–100.0)
MONOS PCT: 11.6 % (ref 3.0–12.0)
Monocytes Absolute: 0.6 10*3/uL (ref 0.1–1.0)
NEUTROS ABS: 3.3 10*3/uL (ref 1.4–7.7)
NEUTROS PCT: 62.8 % (ref 43.0–77.0)
PLATELETS: 241 10*3/uL (ref 150.0–400.0)
RBC: 4.3 Mil/uL (ref 3.87–5.11)
RDW: 13.6 % (ref 11.5–15.5)
WBC: 5.3 10*3/uL (ref 4.0–10.5)

## 2014-09-27 LAB — VITAMIN D 25 HYDROXY (VIT D DEFICIENCY, FRACTURES): VITD: 46.32 ng/mL (ref 30.00–100.00)

## 2014-09-27 LAB — TSH: TSH: 1.78 u[IU]/mL (ref 0.35–4.50)

## 2014-09-27 NOTE — Assessment & Plan Note (Signed)
Lipids, LFTs, TSH  

## 2014-09-27 NOTE — Assessment & Plan Note (Signed)
CBC and dif

## 2014-09-27 NOTE — Assessment & Plan Note (Signed)
Vitamin D level 

## 2014-11-06 ENCOUNTER — Encounter: Payer: Self-pay | Admitting: Internal Medicine

## 2014-11-06 MED ORDER — CITALOPRAM HYDROBROMIDE 40 MG PO TABS: 20.0000 mg | ORAL_TABLET | Freq: Every day | ORAL | Status: AC

## 2014-11-19 ENCOUNTER — Encounter: Payer: Self-pay | Admitting: Internal Medicine

## 2014-11-20 ENCOUNTER — Other Ambulatory Visit: Payer: Self-pay | Admitting: Internal Medicine

## 2014-11-20 ENCOUNTER — Other Ambulatory Visit (INDEPENDENT_AMBULATORY_CARE_PROVIDER_SITE_OTHER): Payer: Medicare HMO

## 2014-11-20 DIAGNOSIS — R300 Dysuria: Secondary | ICD-10-CM

## 2014-11-20 LAB — URINALYSIS, ROUTINE W REFLEX MICROSCOPIC
Bilirubin Urine: NEGATIVE
Ketones, ur: NEGATIVE
Nitrite: POSITIVE — AB
PH: 6 (ref 5.0–8.0)
Urine Glucose: NEGATIVE
Urobilinogen, UA: 1 (ref 0.0–1.0)

## 2014-11-20 MED ORDER — NITROFURANTOIN MONOHYD MACRO 100 MG PO CAPS: 100.0000 mg | ORAL_CAPSULE | Freq: Two times a day (BID) | ORAL | Status: DC

## 2014-11-23 LAB — CULTURE, URINE COMPREHENSIVE: Colony Count: >=100000

## 2014-12-06 ENCOUNTER — Other Ambulatory Visit: Payer: Self-pay | Admitting: Internal Medicine

## 2014-12-06 DIAGNOSIS — N390 Urinary tract infection, site not specified: Secondary | ICD-10-CM | POA: Insufficient documentation

## 2014-12-06 MED ORDER — SULFAMETHOXAZOLE-TRIMETHOPRIM 800-160 MG PO TABS: 1.0000 | ORAL_TABLET | Freq: Two times a day (BID) | ORAL | Status: DC

## 2014-12-25 ENCOUNTER — Ambulatory Visit (INDEPENDENT_AMBULATORY_CARE_PROVIDER_SITE_OTHER): Payer: Medicare HMO | Admitting: Family Medicine

## 2014-12-25 ENCOUNTER — Ambulatory Visit (INDEPENDENT_AMBULATORY_CARE_PROVIDER_SITE_OTHER)
Admission: RE | Admit: 2014-12-25 | Discharge: 2014-12-25 | Disposition: A | Discharge status: Home / Self Care | Payer: Medicare HMO | Source: Ambulatory Visit | Attending: Family Medicine | Admitting: Family Medicine

## 2014-12-25 ENCOUNTER — Encounter: Payer: Self-pay | Admitting: Family Medicine

## 2014-12-25 VITALS — BP 130/70 | HR 70 | Ht 63.75 in | Wt 148.0 lb

## 2014-12-25 DIAGNOSIS — M545 Low back pain: Secondary | ICD-10-CM

## 2014-12-25 DIAGNOSIS — S3992XA Unspecified injury of lower back, initial encounter: Secondary | ICD-10-CM

## 2014-12-25 MED ORDER — TIZANIDINE HCL 2 MG PO TABS: 2.0000 mg | ORAL_TABLET | Freq: Every evening | ORAL | Status: AC

## 2014-12-25 MED ORDER — MELOXICAM 15 MG PO TABS: 15.0000 mg | ORAL_TABLET | Freq: Every day | ORAL | Status: AC

## 2014-12-25 NOTE — Assessment & Plan Note (Signed)
Patient does have lower back pain. I do think that this is more of a muscle spasm and tightness. I do not see any bony abnormality on x-ray. Patient has no spinous process tenderness. Patient is very active. Given home exercises. Given a short course of anti-inflammatories and an icing protocol. I expect her to get better in 2-7 days. Nothing like to see her again in one week to make sure she continues to respond. If patient has any radicular symptoms or weakness she will call or seek medical attention immediately.

## 2014-12-25 NOTE — Patient Instructions (Addendum)
Good to see you Happy holidays!  Meloxicam daily for 5 days then as needed Zanaflex at night if needed Exercises daily if you can.  Stay active See me again in 7-10 days to make sure we are making progress Xray downstairs Iron with 500mg  of vitamin C to help with absorption.
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
## 2014-12-25 NOTE — Progress Notes (Signed)
Pre visit review using our clinic review tool, if applicable. No additional management support is needed unless otherwise documented below in the visit note. 

## 2015-03-19 ENCOUNTER — Other Ambulatory Visit: Payer: Self-pay

## 2015-03-19 DIAGNOSIS — Z1231 Encounter for screening mammogram for malignant neoplasm of breast: Secondary | ICD-10-CM

## 2015-04-04 ENCOUNTER — Ambulatory Visit
Admission: RE | Admit: 2015-04-04 | Discharge: 2015-04-04 | Disposition: A | Discharge status: Home / Self Care | Payer: Medicare HMO | Source: Ambulatory Visit

## 2015-04-04 DIAGNOSIS — Z1231 Encounter for screening mammogram for malignant neoplasm of breast: Secondary | ICD-10-CM

## 2015-07-09 ENCOUNTER — Encounter: Payer: Self-pay | Admitting: Family Medicine

## 2015-07-22 IMAGING — CR DG FOOT COMPLETE 3+V*R*
3 series · 3 of 3 positions shown · non-contrast
Comparison: 09/12/2013

CLINICAL DATA: Followup distal first phalanx heel fracture. Pain
and soreness.

EXAM:
RIGHT FOOT COMPLETE - 3+ VIEW

[view not recorded (1 of 3)]
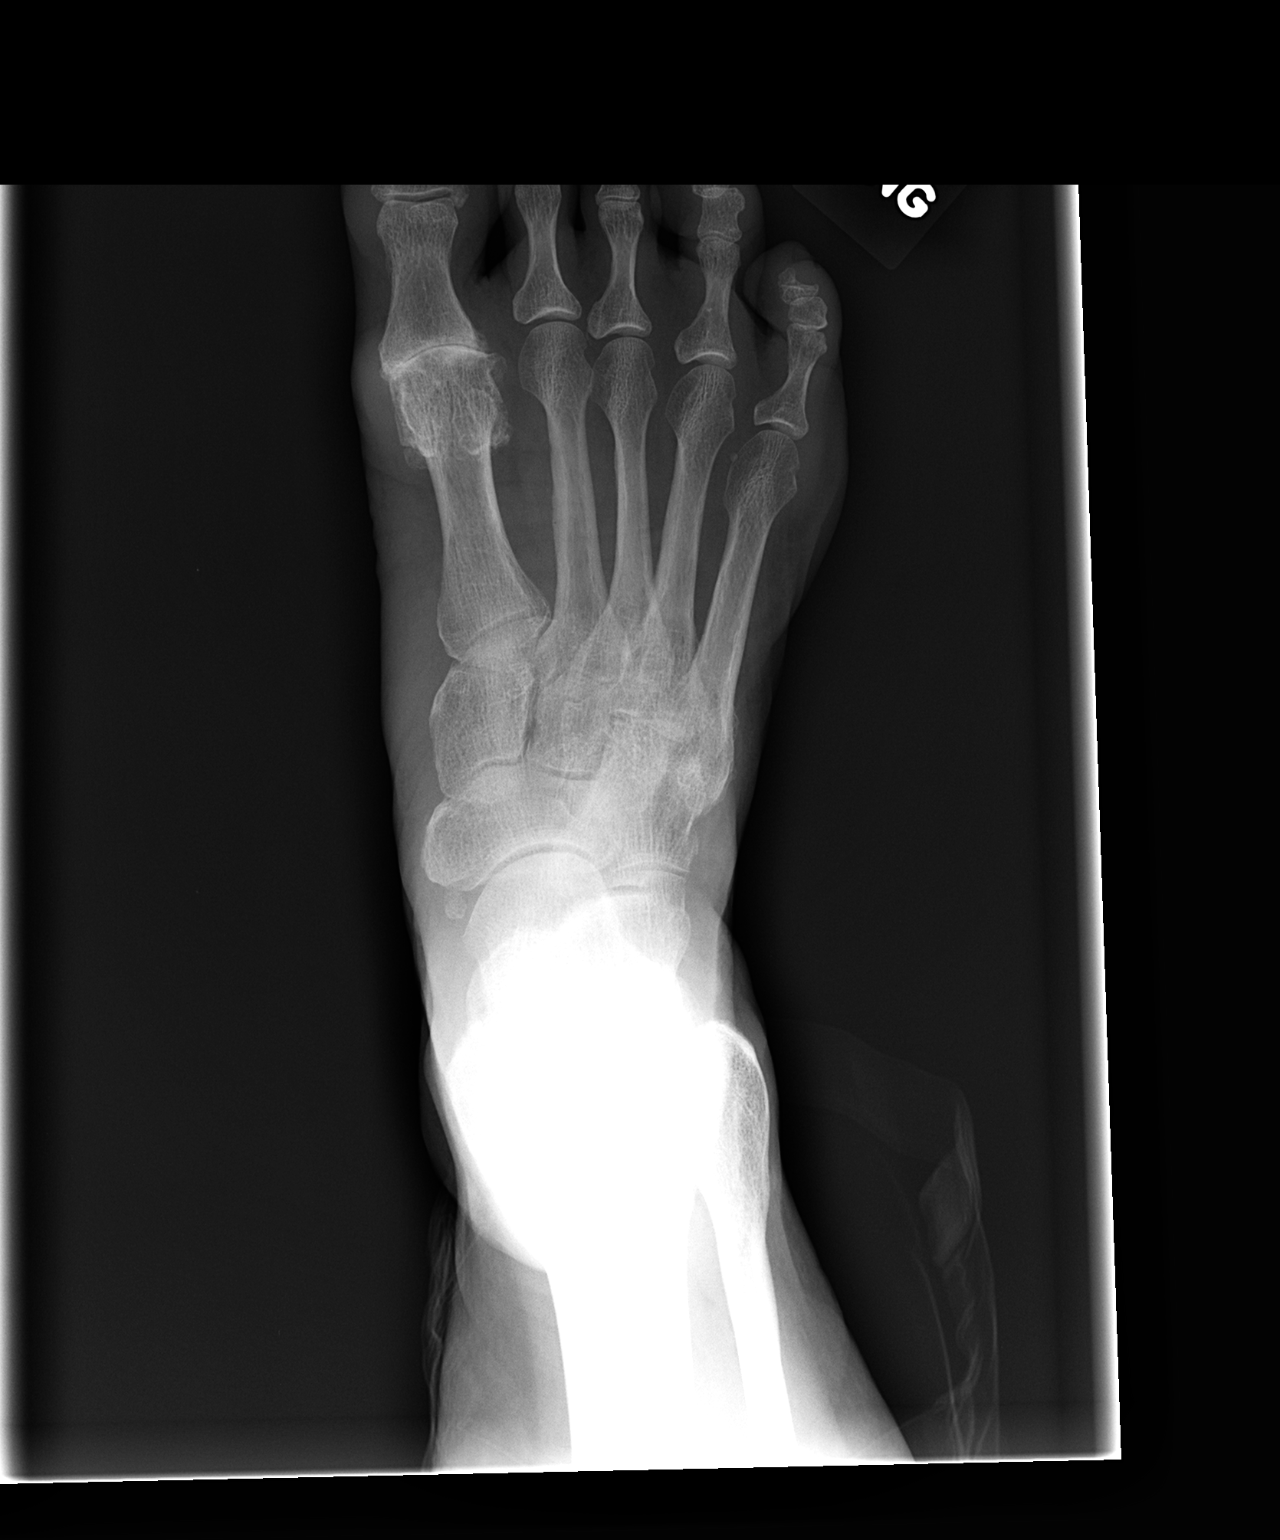

[view not recorded (2 of 3)]
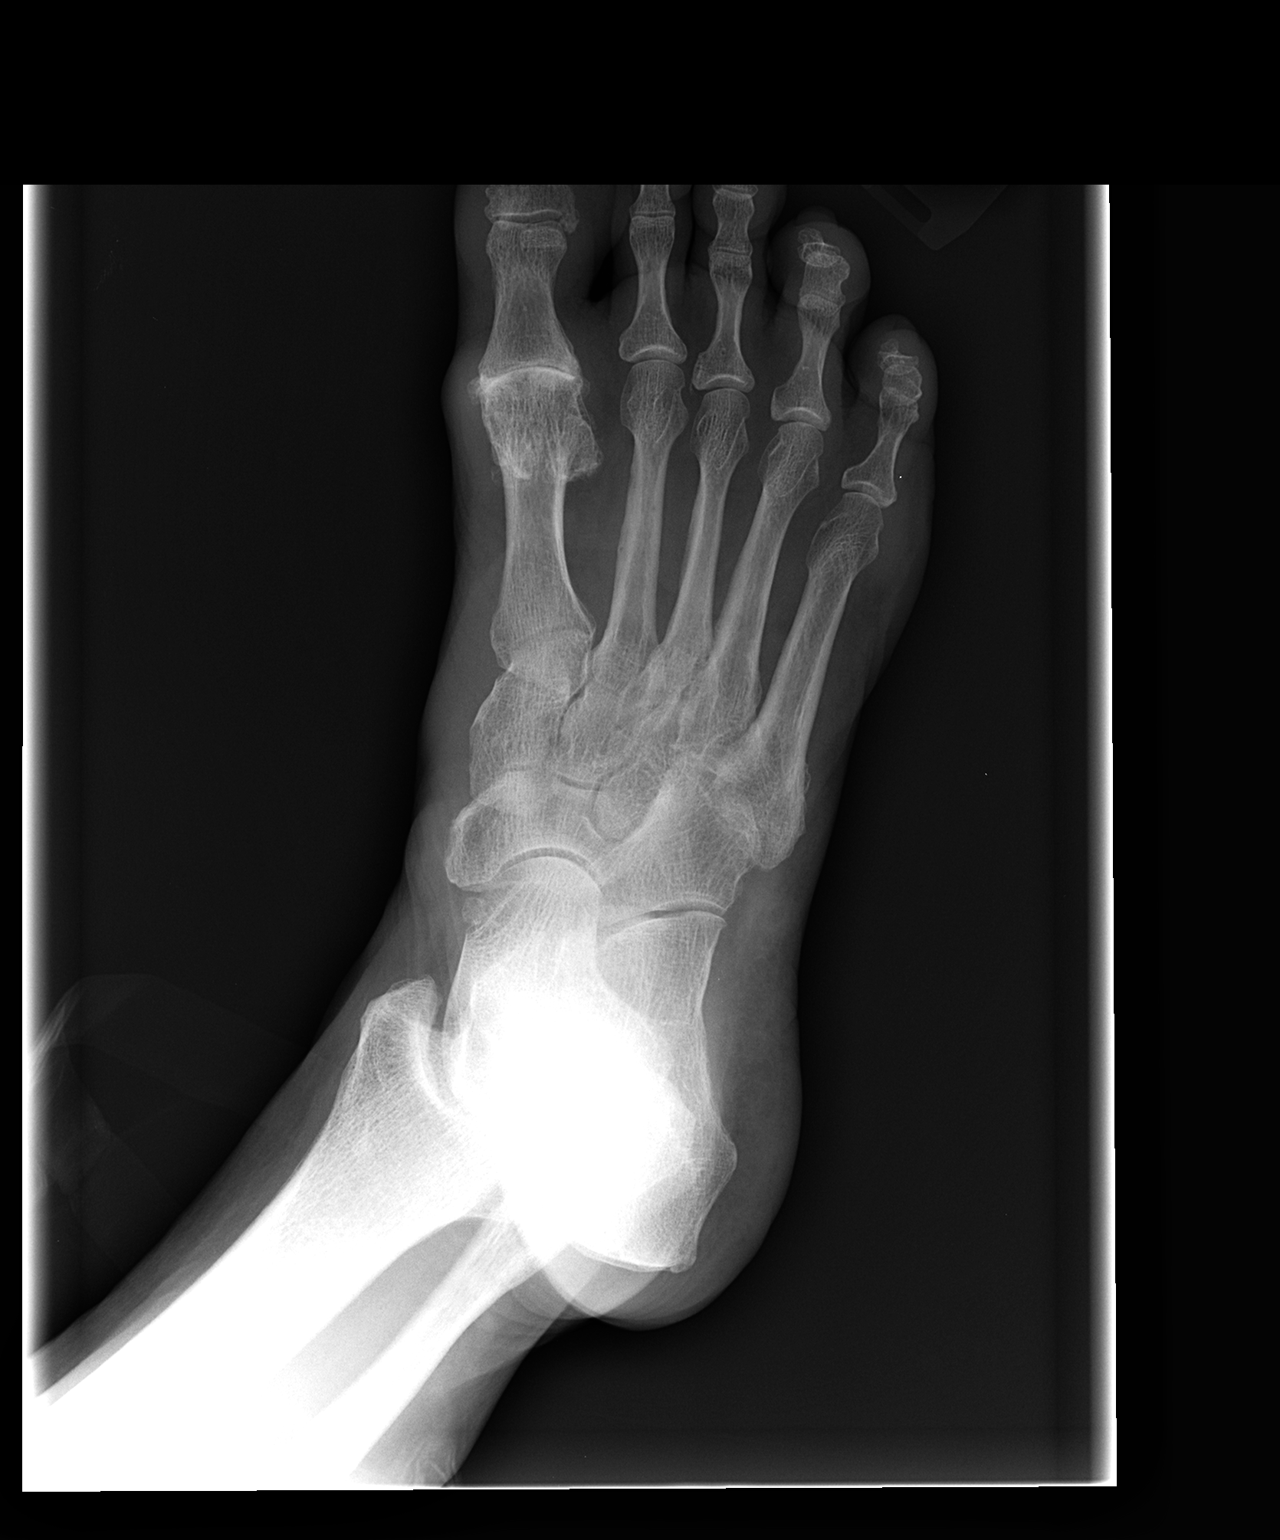

[view not recorded (3 of 3)]
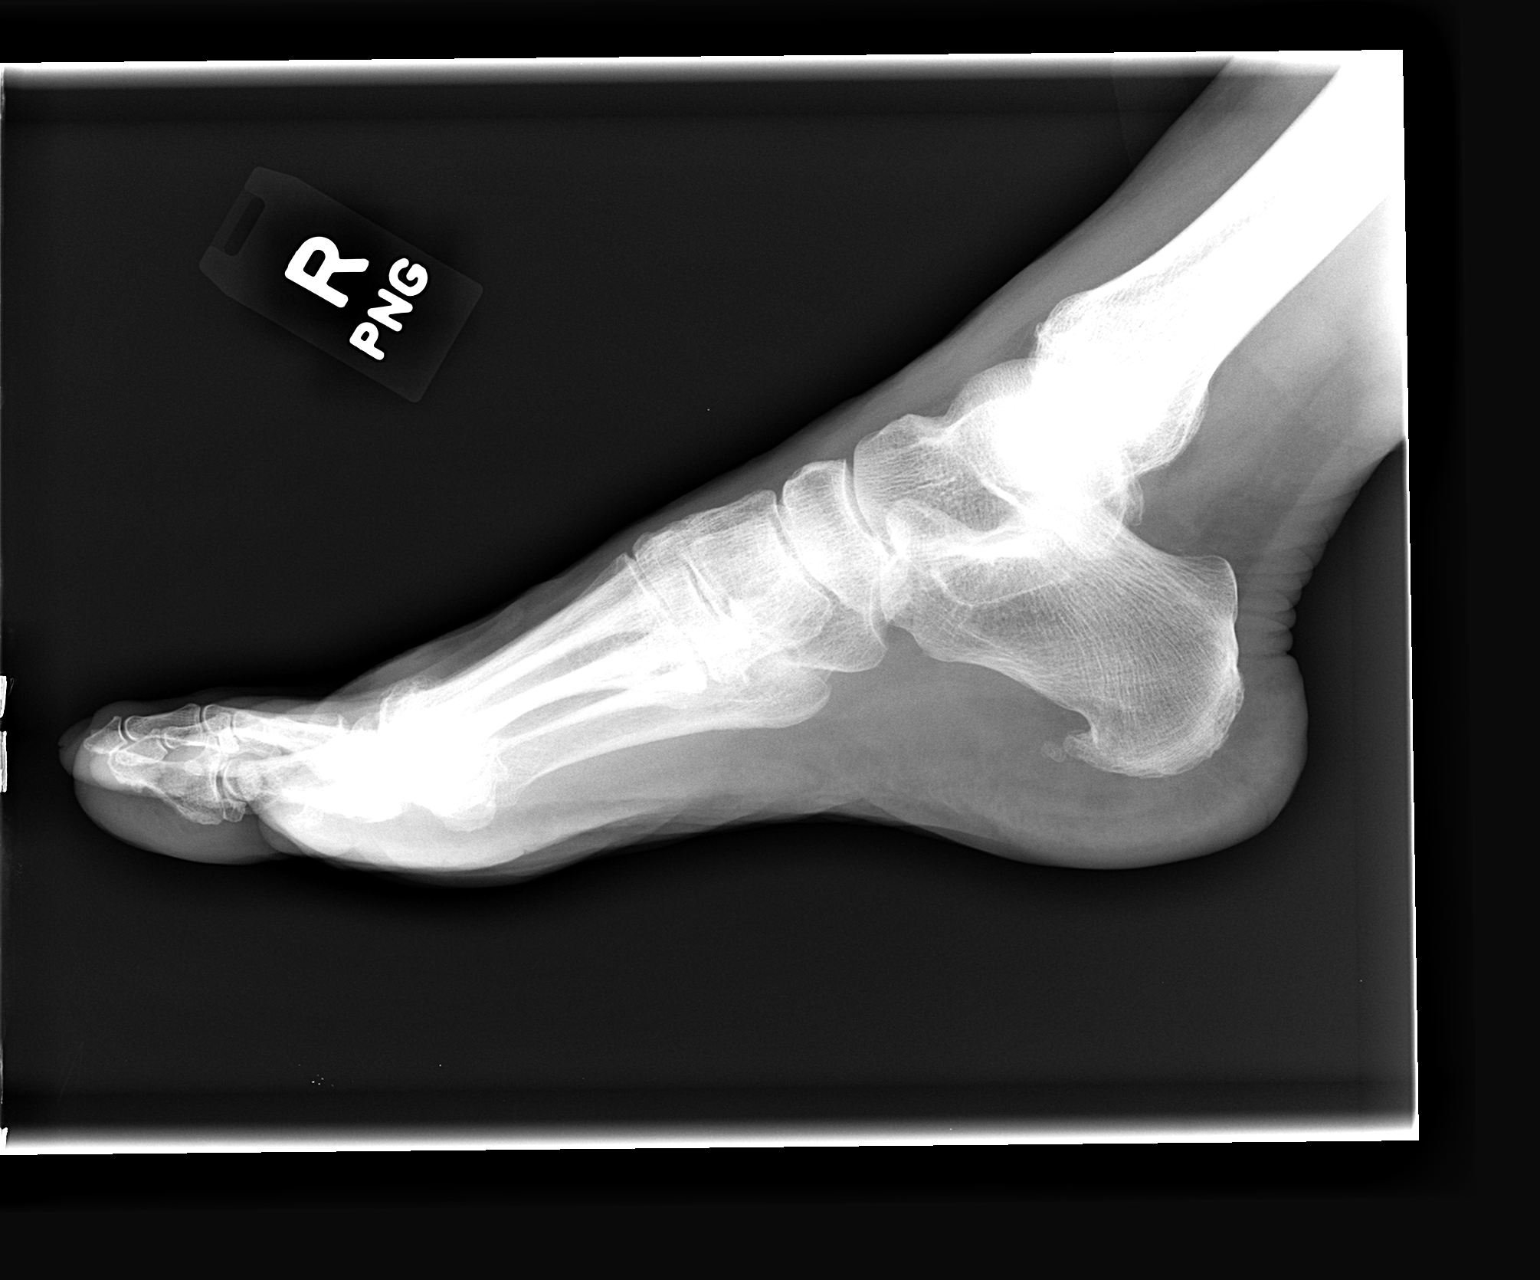

[3 of 3 positions shown; findings below may reference images not displayed]

FINDINGS: Fracture at the base of the right first distal phalanx appears
partially healed. Fracture line along the lateral base remains
partially evident. No new fracture. Advanced osteoarthritis in the
first MTP joint .
IMPRESSION: Partial healing of the right first distal phalangeal fracture.

## 2015-08-19 ENCOUNTER — Encounter: Payer: Self-pay | Admitting: Family Medicine

## 2015-08-19 ENCOUNTER — Ambulatory Visit (INDEPENDENT_AMBULATORY_CARE_PROVIDER_SITE_OTHER): Payer: Medicare HMO | Admitting: Family Medicine

## 2015-08-19 ENCOUNTER — Other Ambulatory Visit: Payer: Self-pay

## 2015-08-19 VITALS — BP 128/80 | HR 67 | Wt 148.0 lb

## 2015-08-19 DIAGNOSIS — M25561 Pain in right knee: Secondary | ICD-10-CM | POA: Diagnosis not present

## 2015-08-19 DIAGNOSIS — S83419A Sprain of medial collateral ligament of unspecified knee, initial encounter: Secondary | ICD-10-CM | POA: Insufficient documentation

## 2015-08-19 DIAGNOSIS — S83411A Sprain of medial collateral ligament of right knee, initial encounter: Secondary | ICD-10-CM | POA: Diagnosis not present

## 2015-08-19 NOTE — Patient Instructions (Signed)
Good to see you  Ice is your friend Wear brace with a lot of walking.  pennsaid pinkie amount topically 2 times daily as needed.  Avoid twisting.  See me again in 3-4 weeks and make sure healing appropriately.

## 2015-08-19 NOTE — Progress Notes (Signed)
[facility] [place], [place] [address] Phone: [phone] Subjective:     CC: right knee pain  [id number]  Diana Lambert is a 70 y.o. female coming in with complaint of right knee pain. Patient describes the pain is more of a dull, throbbing aching sensation. Patient states she was walking a neuropathy in the mild discomfort. That night she was moving in bed and had an audible pop. Significant pain and swelling immediately. Patient likely had a knee brace from a prior arthroscopic procedure and warned which allowed her to continue to ambulate. Since she has been back she has noticed some improvement but continues to have some mild discomfort. Sometimes the knee feels a little unstable. Denies any radiation down the leg, denies any weakness. Intermittent swelling though does exist. Denies any pain at night now.     Past Medical History:  Diagnosis Date  . Hyperlipidemia   . Restless leg syndrome   . Skin cancer    basal cell & squamous; Dr Martinique   Past Surgical History:  Procedure Laterality Date  . CATARACT EXTRACTION     bilaterally; Dr Bing Plume  . COLONOSCOPY  2004 & 2011    negative X 2;Dr.Buccini  . CYSTOSCOPY  2003   for microscopic hematuria;Dr.Kimbrough  . GLAUCOMA SURGERY     laser surgery; Dr Bing Plume  . HEMORROIDECTOMY  09/2011   Dr Morton Stall, [facility]  . KNEE SURGERY  07/2009   Left   . TONSILLECTOMY    . TOTAL ABDOMINAL HYSTERECTOMY W/ BILATERAL SALPINGOOPHORECTOMY     for fibroids;Dr.McPhail   Social History   Social History  . Marital status: Married    Spouse name: N/A  . Number of children: N/A  . Years of education: N/A   Social History Main Topics  . Smoking status: Former Smoker    Quit date: 01/11/1970  . Smokeless tobacco: None     Comment: smoked 1963-1972, up to 1/4 ppd  . Alcohol use 8.4 oz/week    14 Glasses of wine per week     Comment:  socially  . Drug use: No  . Sexual activity: Not Asked   Other  Topics Concern  . None   Social History Narrative  . None   Allergies  Allergen Reactions  . Bacitracin-Polymyxin B     Local rash  . Neomycin     Edema of lids with topical application. No reaction to Flu or Shinglesvaccines  . Tape Rash   Family History  Problem Relation Age of Onset  . Cancer Paternal Grandmother     gynecologic with cns mets  . Renal cancer Paternal Uncle   . Lung cancer Maternal Aunt     2 sisters; only 1 wassmoker  . Alzheimer's disease Paternal Aunt   . Alzheimer's disease Paternal Uncle   . Hyperlipidemia Father   . Other Father     Amyloidosis of kidney  . Hyperlipidemia Mother   . Restless legs syndrome Mother   . Dementia Mother   . Atrial fibrillation Mother     paroxysmal  . Diabetes Sister     pre Diabetes ; obesity    Past medical history, social, surgical and family history all reviewed in electronic medical record.  No pertanent information unless stated regarding to the chief complaint.   Review of Systems: No headache, visual changes, nausea, vomiting, diarrhea, constipation, dizziness, abdominal pain, skin rash, fevers, chills, night sweats, weight loss, swollen lymph nodes, body aches,  joint swelling, muscle aches, chest pain, shortness of breath, mood changes.   Objective  Blood pressure 128/80, pulse 67, weight 148 lb (67.1 kg), SpO2 98 %.  General: No apparent distress alert and oriented x3 mood and affect normal, dressed appropriately.  HEENT: Pupils equal, extraocular movements intact  Respiratory: Patient's speak in full sentences and does not appear short of breath  Cardiovascular: No lower extremity edema, non tender, no erythema  Skin: Warm dry intact with no signs of infection or rash on extremities or on axial skeleton.  Abdomen: Soft nontender  Neuro: Cranial nerves II through XII are intact, neurovascularly intact in all extremities with 2+ DTRs and 2+ pulses.  Lymph: No lymphadenopathy of posterior or anterior  cervical chain or axillae bilaterally.  Gait mild antalgic gait MSK:  Non tender with full range of motion and good stability and symmetric strength and tone of shoulders, elbows, wrist, hip, and ankles bilaterally.  Knee:right Trace effusion noted I'll tenderness over the medial joint line ROM full in flexion and extension and lower leg rotation. Skin laxity of the MCL with Pain Negative Mcmurray's, Apley's, and Thessalonian tests. Non painful patellar compression. Patellar glide with mild crepitus. Patellar and quadriceps tendons unremarkable. Hamstring and quadriceps strength is normal.  Contralateral knee unremarkable   MSK US performed of: right knee This study was ordered, performed, and interpreted by Charlann Boxer D.O.  Knee: All structures visualized. Procedure changes of the medial meniscus. Patient also has what appears to be a small acute on chronic tear of the lateral meniscus. Moderate underlying arthritic changes of the knee and the medial and lateral compartment Patellar Tendon unremarkable on long and transverse views without effusion. No abnormality of prepatellar bursa. MCL does have a tear proximally with gapping noted on dynamic testing. Hypoechoic changes and severe increase in Doppler flow noted  IMPRESSION:  Acute near full-thickness tear of the MCL   Impression and Recommendations:     This case required medical decision making of moderate complexity.      Note: This dictation was prepared with Dragon dictation along with smaller phrase technology. Any transcriptional errors that result from this process are unintentional.

## 2015-08-19 NOTE — Assessment & Plan Note (Signed)
Patient does have a tear of the MCL. Seems to be acute. Seems to be near full-thickness as well. Patient wants to avoid any surgical intervention. Patient is already proximally 3 weeks out from the injury. Not a significant amount healing noted on ultrasound today. Declined a knee immobilizer but patient does have a knee brace that she'll wear more on a regular basis. Home exercises given. We discussed icing regimen. Topical anti-inflammatories given. Patient is to try to make these changes and come back and see me again in 3 weeks to make sure she is responding appropriately

## 2015-09-10 ENCOUNTER — Ambulatory Visit: Payer: Medicare HMO | Admitting: Family Medicine

## 2015-09-27 NOTE — Progress Notes (Signed)
[facility] [place], [place] [address] Phone: [phone] Subjective:     CC: right knee pain f/u  [id number]  Diana Lambert is a 70 y.o. female coming in with complaint of right knee pain. Patient was seen previously after having an audible pop in her knee and was found to have an MCL tear. Patient elected try conservative therapy. Patient doing home exercises, bracing, and avoiding significant activity. Patient states overall she seems doing better. Patient does not seem to be having any significant pain though with her. States twisting motions give it some instability. States overall she feels like she has made progress.    Past Medical History:  Diagnosis Date  . Hyperlipidemia   . Restless leg syndrome   . Skin cancer    basal cell & squamous; Dr Martinique   Past Surgical History:  Procedure Laterality Date  . CATARACT EXTRACTION     bilaterally; Dr Bing Plume  . COLONOSCOPY  2004 & 2011    negative X 2;Dr.Buccini  . CYSTOSCOPY  2003   for microscopic hematuria;Dr.Kimbrough  . GLAUCOMA SURGERY     laser surgery; Dr Bing Plume  . HEMORROIDECTOMY  09/2011   Dr Morton Stall, [facility]  . KNEE SURGERY  07/2009   Left   . TONSILLECTOMY    . TOTAL ABDOMINAL HYSTERECTOMY W/ BILATERAL SALPINGOOPHORECTOMY     for fibroids;Dr.McPhail   Social History   Social History  . Marital status: Married    Spouse name: N/A  . Number of children: N/A  . Years of education: N/A   Social History Main Topics  . Smoking status: Former Smoker    Quit date: 01/11/1970  . Smokeless tobacco: None     Comment: smoked 1963-1972, up to 1/4 ppd  . Alcohol use 8.4 oz/week    14 Glasses of wine per week     Comment:  socially  . Drug use: No  . Sexual activity: Not Asked   Other Topics Concern  . None   Social History Narrative  . None   Allergies  Allergen Reactions  . Bacitracin-Polymyxin B     Local rash  . Neomycin     Edema of lids with topical  application. No reaction to Flu or Shinglesvaccines  . Tape Rash   Family History  Problem Relation Age of Onset  . Cancer Paternal Grandmother     gynecologic with cns mets  . Renal cancer Paternal Uncle   . Lung cancer Maternal Aunt     2 sisters; only 1 wassmoker  . Alzheimer's disease Paternal Aunt   . Alzheimer's disease Paternal Uncle   . Hyperlipidemia Father   . Other Father     Amyloidosis of kidney  . Hyperlipidemia Mother   . Restless legs syndrome Mother   . Dementia Mother   . Atrial fibrillation Mother     paroxysmal  . Diabetes Sister     pre Diabetes ; obesity    Past medical history, social, surgical and family history all reviewed in electronic medical record.  No pertanent information unless stated regarding to the chief complaint.   Review of Systems: No headache, visual changes, nausea, vomiting, diarrhea, constipation, dizziness, abdominal pain, skin rash, fevers, chills, night sweats, weight loss, swollen lymph nodes, body aches, joint swelling, muscle aches, chest pain, shortness of breath, mood changes.   Objective  Blood pressure 116/80, pulse 70, SpO2 97 %.  General: No apparent distress alert and oriented x3 mood and  affect normal, dressed appropriately.  HEENT: Pupils equal, extraocular movements intact  Respiratory: Patient's speak in full sentences and does not appear short of breath  Cardiovascular: No lower extremity edema, non tender, no erythema  Skin: Warm dry intact with no signs of infection or rash on extremities or on axial skeleton.  Abdomen: Soft nontender  Neuro: Cranial nerves II through XII are intact, neurovascularly intact in all extremities with 2+ DTRs and 2+ pulses.  Lymph: No lymphadenopathy of posterior or anterior cervical chain or axillae bilaterally.  Gait mild antalgic gait MSK:  Non tender with full range of motion and good stability and symmetric strength and tone of shoulders, elbows, wrist, hip, and ankles bilaterally.   Knee:right No effusion noted Continue mild tenderness over the medial joint line ROM full in flexion and extension and lower leg rotation. Continue mild laxity of the MCL but now has an endpoint Negative Mcmurray's, Apley's, and Thessalonian tests. painful patellar compression. Patellar glide with mild crepitus. Patellar and quadriceps tendons unremarkable. Hamstring and quadriceps strength is normal.  Contralateral knee unremarkable   MSK US performed of: right knee This study was ordered, performed, and interpreted by Charlann Boxer D.O.  Knee: All structures visualized. Postsurgical changes of the meniscus noted again. Patient's MCL does show some scar tissue formation. Does appear that 75% of the tear was there previously. Patellar Tendon unremarkable on long and transverse views without effusion. No abnormality of prepatellar bursa. MCL does have a tear proximally with gapping noted on dynamic testing. Hypoechoic changes and severe increase in Doppler flow noted  IMPRESSION:  Healing of an MCL tear.   Impression and Recommendations:     This case required medical decision making of moderate complexity.      Note: This dictation was prepared with Dragon dictation along with smaller phrase technology. Any transcriptional errors that result from this process are unintentional.

## 2015-09-29 ENCOUNTER — Encounter: Payer: Self-pay | Admitting: Family Medicine

## 2015-09-29 ENCOUNTER — Other Ambulatory Visit: Payer: Self-pay

## 2015-09-29 ENCOUNTER — Ambulatory Visit (INDEPENDENT_AMBULATORY_CARE_PROVIDER_SITE_OTHER): Payer: Medicare HMO | Admitting: Family Medicine

## 2015-09-29 VITALS — BP 116/80 | HR 70

## 2015-09-29 DIAGNOSIS — S83411D Sprain of medial collateral ligament of right knee, subsequent encounter: Secondary | ICD-10-CM

## 2015-09-29 NOTE — Assessment & Plan Note (Signed)
Patient is making progress at this time. We discussed icing regimen and home exercises. We discussed which activities to do a which was to potentially avoid. Patient has done very well. Patient come back and see me again in 6 weeks. Patient will continue the brace we discussed other possibilities if patient does not seem to respond to conservative therapy. Spent  25 minutes with patient face-to-face and had greater than 50% of counseling including as described above in assessment and plan.

## 2015-09-29 NOTE — Patient Instructions (Signed)
Keep it up  Ice is your friend Avoid twisting Exercises 2-3 times a week See me again in 6 weeks.

## 2015-11-10 ENCOUNTER — Ambulatory Visit: Payer: Self-pay

## 2015-11-10 ENCOUNTER — Ambulatory Visit: Payer: Medicare HMO | Admitting: Internal Medicine

## 2015-11-10 ENCOUNTER — Encounter: Payer: Self-pay | Admitting: Family Medicine

## 2015-11-10 ENCOUNTER — Ambulatory Visit (INDEPENDENT_AMBULATORY_CARE_PROVIDER_SITE_OTHER): Payer: Medicare HMO | Admitting: Family Medicine

## 2015-11-10 ENCOUNTER — Ambulatory Visit (INDEPENDENT_AMBULATORY_CARE_PROVIDER_SITE_OTHER)
Admission: RE | Admit: 2015-11-10 | Discharge: 2015-11-10 | Disposition: A | Discharge status: Home / Self Care | Payer: Medicare HMO | Source: Ambulatory Visit | Attending: Family Medicine | Admitting: Family Medicine

## 2015-11-10 VITALS — BP 108/62 | HR 67 | Wt 147.0 lb

## 2015-11-10 DIAGNOSIS — M170 Bilateral primary osteoarthritis of knee: Secondary | ICD-10-CM | POA: Insufficient documentation

## 2015-11-10 DIAGNOSIS — M25561 Pain in right knee: Secondary | ICD-10-CM

## 2015-11-10 DIAGNOSIS — S83411D Sprain of medial collateral ligament of right knee, subsequent encounter: Secondary | ICD-10-CM | POA: Diagnosis not present

## 2015-11-10 DIAGNOSIS — M25562 Pain in left knee: Secondary | ICD-10-CM

## 2015-11-10 MED ORDER — DICLOFENAC SODIUM 2 % TD SOLN: TRANSDERMAL | 3 refills | Status: AC

## 2015-11-10 NOTE — Assessment & Plan Note (Signed)
Healing at this time. We discussed with patient that I think she'll do very well.

## 2015-11-10 NOTE — Patient Instructions (Signed)
Good to see you  Bilateral standing knee xrays downstairs Ice 20 minutes 2 times daily. Usually after activity and before bed. pennsaid pinkie amount topically 2 times daily as needed.  Exercises 3 times a week.  Avoid twisting motins.  If the baker cyst or the knee hurts too much we have different type of injections and can get you a custom brace Lets see how you are doing in 4 weeks.

## 2015-11-10 NOTE — Progress Notes (Signed)
[name] [address] [place], [place] [address] Phone: [phone] Subjective:     CC: right knee pain f/u  Left-sided knee pain  [id number]  Diana Lambert is a 70 y.o. female coming in with complaint of right knee pain. Patient was seen previously after having an audible pop in her knee and was found to have an MCL tear. Patient elected try conservative therapy. Has been doing very well. No longer having significant pain. Patient states that it feels to be doing very well. Mild soreness from time to time but no instability.  Asian actually is complaining of a new prone. Left-sided knee pain. On the medial aspect. Increasing instability. Does not rib number any true injury. Feels very similar to the contralateral side when it was significantly hurting. Patient states that sometimes he can catch her and almost make her feel like she can fall. Rates the severity of pain a 6 out of 10. Has not tried any over-the-counter medications regularly. Does feel that the topical anti-inflammatories is helpful.    Past Medical History:  Diagnosis Date  . Hyperlipidemia   . Restless leg syndrome   . Skin cancer    basal cell & squamous; Dr Martinique   Past Surgical History:  Procedure Laterality Date  . CATARACT EXTRACTION     bilaterally; Dr Bing Plume  . COLONOSCOPY  2004 & 2011    negative X 2;Dr.Buccini  . CYSTOSCOPY  2003   for microscopic hematuria;Dr.Kimbrough  . GLAUCOMA SURGERY     laser surgery; Dr Bing Plume  . HEMORROIDECTOMY  09/2011   Dr Morton Stall, [facility]  . KNEE SURGERY  07/2009   Left   . TONSILLECTOMY    . TOTAL ABDOMINAL HYSTERECTOMY W/ BILATERAL SALPINGOOPHORECTOMY     for fibroids;Dr.McPhail   Social History   Social History  . Marital status: Married    Spouse name: N/A  . Number of children: N/A  . Years of education: N/A   Social History Main Topics  . Smoking status: Former Smoker    Quit date: 01/11/1970  . Smokeless tobacco: None   Comment: smoked 1963-1972, up to 1/4 ppd  . Alcohol use 8.4 oz/week    14 Glasses of wine per week     Comment:  socially  . Drug use: No  . Sexual activity: Not Asked   Other Topics Concern  . None   Social History Narrative  . None   Allergies  Allergen Reactions  . Bacitracin-Polymyxin B     Local rash  . Neomycin     Edema of lids with topical application. No reaction to Flu or Shinglesvaccines  . Tape Rash   Family History  Problem Relation Age of Onset  . Cancer Paternal Grandmother     gynecologic with c[facility]  . Renal cancer Paternal Uncle   . Lung cancer Maternal Aunt     2 sisters; only 1 wassmoker  . Alzheimer's disease Paternal Aunt   . Alzheimer's disease Paternal Uncle   . Hyperlipidemia Father   . Other Father     Amyloidosis of kidney  . Hyperlipidemia Mother   . Restless legs syndrome Mother   . Dementia Mother   . Atrial fibrillation Mother     paroxysmal  . Diabetes Sister     pre Diabetes ; obesity    Past medical history, social, surgical and family history all reviewed in electronic medical record.  No pertanent information unless stated regarding to the chief  complaint.   Review of Systems: No headache, visual changes, nausea, vomiting, diarrhea, constipation, dizziness, abdominal pain, skin rash, fevers, chills, night sweats, weight loss, swollen lymph nodes, body aches, joint swelling, muscle aches, chest pain, shortness of breath, mood changes.   Objective  Blood pressure 108/62, pulse 67, weight 147 lb (66.7 kg), SpO2 95 %.  Patient's physical exam has been further evaluated and updated and is detailed for exam on 11/10/15  General: No apparent distress alert and oriented x3 mood and affect normal, dressed appropriately.  HEENT: Pupils equal, extraocular movements intact  Respiratory: Patient's speak in full sentences and does not appear short of breath  Cardiovascular: No lower extremity edema, non tender, no erythema  Skin: Warm dry  intact with no signs of infection or rash on extremities or on axial skeleton.  Abdomen: Soft nontender  Neuro: Cranial nerves II through XII are intact, neurovascularly intact in all extremities with 2+ DTRs and 2+ pulses.  Lymph: No lymphadenopathy of posterior or anterior cervical chain or axillae bilaterally.  Gait mild antalgic gait MSK:  Non tender with full range of motion and good stability and symmetric strength and tone of shoulders, elbows, wrist, hip, and ankles bilaterally.  Knee:right No effusion noted Continue mild tenderness over the medial joint line ROM full in flexion and extension and lower leg rotation. MCL intact Negative Mcmurray's, Apley's, and Thessalonian tests. painful patellar compression. Patellar glide with mild crepitus. Patellar and quadriceps tendons unremarkable. Hamstring and quadriceps strength is normal.   Knee: Left Mild valgus deformity noted. Mild pain over the medial joint line ROM full in flexion and extension and lower leg rotation. Ligaments with solid consistent endpoints including ACL, PCL, LCL, MCL. Positive Mcmurray's, Apley's, and Thessalonian tests. Non painful patellar compression. Patellar glide without crepitus. Patellar and quadriceps tendons unremarkable. Hamstring and quadriceps strength is normal.      Impression and Recommendations:     This case required medical decision making of moderate complexity.      Note: This dictation was prepared with Dragon dictation along with smaller phrase technology. Any transcriptional errors that result from this process are unintentional.

## 2015-11-10 NOTE — Assessment & Plan Note (Signed)
New problem  I do believe the patient's pain is more secondary to osteophytic changes. We discussed with patient at great length. Patient does have symptoms that is consistent with a meniscal tear as well. Patient does not have any's gross instability of the knee noted today. We discussed topical anti-inflammatories and icing. Patient will continue with conservative therapy. Follow-up in 4 weeks and if not better we'll consider injection.

## 2016-01-09 ENCOUNTER — Other Ambulatory Visit: Payer: Self-pay | Admitting: Internal Medicine

## 2016-01-15 ENCOUNTER — Other Ambulatory Visit: Payer: Self-pay | Admitting: Internal Medicine

## 2016-02-10 ENCOUNTER — Telehealth: Payer: Self-pay | Admitting: Internal Medicine

## 2016-02-10 NOTE — Telephone Encounter (Signed)
Pt called in and said that Linna Darner had suggested to for her to come see you.  She just has never reestablished.  She wanted to know if you would take her on as a pt?

## 2016-02-11 NOTE — Telephone Encounter (Signed)
Let her know my practice is now closed - we have charlotte who has joined our practice in the last year and she is accepting new patients.

## 2016-03-03 DIAGNOSIS — M859 Disorder of bone density and structure, unspecified: Secondary | ICD-10-CM | POA: Diagnosis not present

## 2016-03-03 DIAGNOSIS — Z6826 Body mass index (BMI) 26.0-26.9, adult: Secondary | ICD-10-CM | POA: Diagnosis not present

## 2016-03-03 DIAGNOSIS — Z1389 Encounter for screening for other disorder: Secondary | ICD-10-CM | POA: Diagnosis not present

## 2016-03-03 DIAGNOSIS — E559 Vitamin D deficiency, unspecified: Secondary | ICD-10-CM | POA: Diagnosis not present

## 2016-03-03 DIAGNOSIS — M179 Osteoarthritis of knee, unspecified: Secondary | ICD-10-CM | POA: Diagnosis not present

## 2016-03-03 DIAGNOSIS — N951 Menopausal and female climacteric states: Secondary | ICD-10-CM | POA: Diagnosis not present

## 2016-04-07 DIAGNOSIS — H02834 Dermatochalasis of left upper eyelid: Secondary | ICD-10-CM | POA: Diagnosis not present

## 2016-04-07 DIAGNOSIS — H02835 Dermatochalasis of left lower eyelid: Secondary | ICD-10-CM | POA: Diagnosis not present

## 2016-04-07 DIAGNOSIS — H26491 Other secondary cataract, right eye: Secondary | ICD-10-CM | POA: Diagnosis not present

## 2016-04-07 DIAGNOSIS — H04123 Dry eye syndrome of bilateral lacrimal glands: Secondary | ICD-10-CM | POA: Diagnosis not present

## 2016-04-07 DIAGNOSIS — H524 Presbyopia: Secondary | ICD-10-CM | POA: Diagnosis not present

## 2016-04-07 DIAGNOSIS — H02403 Unspecified ptosis of bilateral eyelids: Secondary | ICD-10-CM | POA: Diagnosis not present

## 2016-04-21 ENCOUNTER — Other Ambulatory Visit: Payer: Self-pay | Admitting: Internal Medicine

## 2016-04-21 DIAGNOSIS — Z1231 Encounter for screening mammogram for malignant neoplasm of breast: Secondary | ICD-10-CM

## 2016-05-18 ENCOUNTER — Ambulatory Visit
Admission: RE | Admit: 2016-05-18 | Discharge: 2016-05-18 | Disposition: A | Discharge status: Home / Self Care | Payer: PPO | Source: Ambulatory Visit | Attending: Internal Medicine | Admitting: Internal Medicine

## 2016-05-18 DIAGNOSIS — Z1231 Encounter for screening mammogram for malignant neoplasm of breast: Secondary | ICD-10-CM

## 2016-06-10 DIAGNOSIS — M859 Disorder of bone density and structure, unspecified: Secondary | ICD-10-CM | POA: Diagnosis not present

## 2016-06-10 DIAGNOSIS — Z0000 Encounter for general adult medical examination without abnormal findings: Secondary | ICD-10-CM | POA: Diagnosis not present

## 2016-06-18 DIAGNOSIS — Z0000 Encounter for general adult medical examination without abnormal findings: Secondary | ICD-10-CM | POA: Diagnosis not present

## 2016-06-18 DIAGNOSIS — Z1212 Encounter for screening for malignant neoplasm of rectum: Secondary | ICD-10-CM | POA: Diagnosis not present

## 2016-06-18 DIAGNOSIS — M859 Disorder of bone density and structure, unspecified: Secondary | ICD-10-CM | POA: Diagnosis not present

## 2016-06-18 DIAGNOSIS — N951 Menopausal and female climacteric states: Secondary | ICD-10-CM | POA: Diagnosis not present

## 2016-06-18 DIAGNOSIS — Z6826 Body mass index (BMI) 26.0-26.9, adult: Secondary | ICD-10-CM | POA: Diagnosis not present

## 2016-06-18 DIAGNOSIS — Z1389 Encounter for screening for other disorder: Secondary | ICD-10-CM | POA: Diagnosis not present

## 2016-07-26 DIAGNOSIS — D2371 Other benign neoplasm of skin of right lower limb, including hip: Secondary | ICD-10-CM | POA: Diagnosis not present

## 2016-07-26 DIAGNOSIS — L570 Actinic keratosis: Secondary | ICD-10-CM | POA: Diagnosis not present

## 2016-07-26 DIAGNOSIS — L814 Other melanin hyperpigmentation: Secondary | ICD-10-CM | POA: Diagnosis not present

## 2016-07-26 DIAGNOSIS — Z85828 Personal history of other malignant neoplasm of skin: Secondary | ICD-10-CM | POA: Diagnosis not present

## 2016-07-26 DIAGNOSIS — D225 Melanocytic nevi of trunk: Secondary | ICD-10-CM | POA: Diagnosis not present

## 2016-07-26 DIAGNOSIS — D2212 Melanocytic nevi of left eyelid, including canthus: Secondary | ICD-10-CM | POA: Diagnosis not present

## 2016-07-26 DIAGNOSIS — L821 Other seborrheic keratosis: Secondary | ICD-10-CM | POA: Diagnosis not present

## 2016-10-23 DIAGNOSIS — Z23 Encounter for immunization: Secondary | ICD-10-CM | POA: Diagnosis not present

## 2017-05-19 ENCOUNTER — Encounter: Payer: Self-pay | Admitting: Family Medicine

## 2017-05-19 DIAGNOSIS — Z01419 Encounter for gynecological examination (general) (routine) without abnormal findings: Secondary | ICD-10-CM | POA: Diagnosis not present

## 2017-05-19 DIAGNOSIS — R3915 Urgency of urination: Secondary | ICD-10-CM | POA: Diagnosis not present

## 2017-05-26 DIAGNOSIS — H02834 Dermatochalasis of left upper eyelid: Secondary | ICD-10-CM | POA: Diagnosis not present

## 2017-05-26 DIAGNOSIS — H02835 Dermatochalasis of left lower eyelid: Secondary | ICD-10-CM | POA: Diagnosis not present

## 2017-05-26 DIAGNOSIS — H02403 Unspecified ptosis of bilateral eyelids: Secondary | ICD-10-CM | POA: Diagnosis not present

## 2017-05-26 DIAGNOSIS — H35373 Puckering of macula, bilateral: Secondary | ICD-10-CM | POA: Diagnosis not present

## 2017-05-26 DIAGNOSIS — H524 Presbyopia: Secondary | ICD-10-CM | POA: Diagnosis not present

## 2017-06-20 ENCOUNTER — Other Ambulatory Visit: Payer: Self-pay | Admitting: Internal Medicine

## 2017-06-20 DIAGNOSIS — M859 Disorder of bone density and structure, unspecified: Secondary | ICD-10-CM | POA: Diagnosis not present

## 2017-06-20 DIAGNOSIS — Z1231 Encounter for screening mammogram for malignant neoplasm of breast: Secondary | ICD-10-CM

## 2017-06-20 DIAGNOSIS — R82998 Other abnormal findings in urine: Secondary | ICD-10-CM | POA: Diagnosis not present

## 2017-06-20 DIAGNOSIS — Z0000 Encounter for general adult medical examination without abnormal findings: Secondary | ICD-10-CM | POA: Diagnosis not present

## 2017-06-23 DIAGNOSIS — Z1212 Encounter for screening for malignant neoplasm of rectum: Secondary | ICD-10-CM | POA: Diagnosis not present

## 2017-06-29 DIAGNOSIS — M859 Disorder of bone density and structure, unspecified: Secondary | ICD-10-CM | POA: Diagnosis not present

## 2017-06-29 DIAGNOSIS — M1712 Unilateral primary osteoarthritis, left knee: Secondary | ICD-10-CM | POA: Diagnosis not present

## 2017-06-29 DIAGNOSIS — E559 Vitamin D deficiency, unspecified: Secondary | ICD-10-CM | POA: Diagnosis not present

## 2017-06-29 DIAGNOSIS — M1711 Unilateral primary osteoarthritis, right knee: Secondary | ICD-10-CM | POA: Diagnosis not present

## 2017-06-29 DIAGNOSIS — Z23 Encounter for immunization: Secondary | ICD-10-CM | POA: Diagnosis not present

## 2017-06-29 DIAGNOSIS — Z6824 Body mass index (BMI) 24.0-24.9, adult: Secondary | ICD-10-CM | POA: Diagnosis not present

## 2017-06-29 DIAGNOSIS — Z1389 Encounter for screening for other disorder: Secondary | ICD-10-CM | POA: Diagnosis not present

## 2017-06-29 DIAGNOSIS — Z0000 Encounter for general adult medical examination without abnormal findings: Secondary | ICD-10-CM | POA: Diagnosis not present

## 2017-07-11 ENCOUNTER — Ambulatory Visit
Admission: RE | Admit: 2017-07-11 | Discharge: 2017-07-11 | Disposition: A | Discharge status: Home / Self Care | Payer: PPO | Source: Ambulatory Visit | Attending: Internal Medicine | Admitting: Internal Medicine

## 2017-07-11 DIAGNOSIS — Z1231 Encounter for screening mammogram for malignant neoplasm of breast: Secondary | ICD-10-CM

## 2017-07-25 DIAGNOSIS — M545 Low back pain: Secondary | ICD-10-CM | POA: Diagnosis not present

## 2017-07-25 DIAGNOSIS — S76312A Strain of muscle, fascia and tendon of the posterior muscle group at thigh level, left thigh, initial encounter: Secondary | ICD-10-CM | POA: Diagnosis not present

## 2017-08-03 DIAGNOSIS — M25552 Pain in left hip: Secondary | ICD-10-CM | POA: Diagnosis not present

## 2017-08-17 DIAGNOSIS — M25552 Pain in left hip: Secondary | ICD-10-CM | POA: Diagnosis not present

## 2017-09-07 DIAGNOSIS — Z85828 Personal history of other malignant neoplasm of skin: Secondary | ICD-10-CM | POA: Diagnosis not present

## 2017-09-07 DIAGNOSIS — D485 Neoplasm of uncertain behavior of skin: Secondary | ICD-10-CM | POA: Diagnosis not present

## 2017-09-07 DIAGNOSIS — L821 Other seborrheic keratosis: Secondary | ICD-10-CM | POA: Diagnosis not present

## 2017-09-07 DIAGNOSIS — B078 Other viral warts: Secondary | ICD-10-CM | POA: Diagnosis not present

## 2017-09-07 DIAGNOSIS — D225 Melanocytic nevi of trunk: Secondary | ICD-10-CM | POA: Diagnosis not present

## 2017-10-08 DIAGNOSIS — Z23 Encounter for immunization: Secondary | ICD-10-CM | POA: Diagnosis not present

## 2018-03-10 DIAGNOSIS — R6883 Chills (without fever): Secondary | ICD-10-CM | POA: Diagnosis not present

## 2018-03-10 DIAGNOSIS — R05 Cough: Secondary | ICD-10-CM | POA: Diagnosis not present

## 2018-03-10 DIAGNOSIS — J329 Chronic sinusitis, unspecified: Secondary | ICD-10-CM | POA: Diagnosis not present

## 2018-06-15 DIAGNOSIS — Z01419 Encounter for gynecological examination (general) (routine) without abnormal findings: Secondary | ICD-10-CM | POA: Diagnosis not present

## 2018-06-15 DIAGNOSIS — R350 Frequency of micturition: Secondary | ICD-10-CM | POA: Diagnosis not present

## 2018-06-27 DIAGNOSIS — M859 Disorder of bone density and structure, unspecified: Secondary | ICD-10-CM | POA: Diagnosis not present

## 2018-06-27 DIAGNOSIS — Z79899 Other long term (current) drug therapy: Secondary | ICD-10-CM | POA: Diagnosis not present

## 2018-06-27 DIAGNOSIS — N951 Menopausal and female climacteric states: Secondary | ICD-10-CM | POA: Diagnosis not present

## 2018-06-27 DIAGNOSIS — R7989 Other specified abnormal findings of blood chemistry: Secondary | ICD-10-CM | POA: Diagnosis not present

## 2018-06-28 DIAGNOSIS — R82998 Other abnormal findings in urine: Secondary | ICD-10-CM | POA: Diagnosis not present

## 2018-07-04 DIAGNOSIS — Z0000 Encounter for general adult medical examination without abnormal findings: Secondary | ICD-10-CM | POA: Diagnosis not present

## 2018-07-04 DIAGNOSIS — E559 Vitamin D deficiency, unspecified: Secondary | ICD-10-CM | POA: Diagnosis not present

## 2018-07-04 DIAGNOSIS — Z1331 Encounter for screening for depression: Secondary | ICD-10-CM | POA: Diagnosis not present

## 2018-07-04 DIAGNOSIS — M8580 Other specified disorders of bone density and structure, unspecified site: Secondary | ICD-10-CM | POA: Diagnosis not present

## 2018-07-17 DIAGNOSIS — H40033 Anatomical narrow angle, bilateral: Secondary | ICD-10-CM | POA: Diagnosis not present

## 2018-07-17 DIAGNOSIS — H02834 Dermatochalasis of left upper eyelid: Secondary | ICD-10-CM | POA: Diagnosis not present

## 2018-07-17 DIAGNOSIS — H04123 Dry eye syndrome of bilateral lacrimal glands: Secondary | ICD-10-CM | POA: Diagnosis not present

## 2018-07-17 DIAGNOSIS — Z961 Presence of intraocular lens: Secondary | ICD-10-CM | POA: Diagnosis not present

## 2018-07-17 DIAGNOSIS — H02403 Unspecified ptosis of bilateral eyelids: Secondary | ICD-10-CM | POA: Diagnosis not present

## 2018-08-03 DIAGNOSIS — Z20828 Contact with and (suspected) exposure to other viral communicable diseases: Secondary | ICD-10-CM | POA: Diagnosis not present

## 2018-08-15 ENCOUNTER — Other Ambulatory Visit: Payer: Self-pay | Admitting: Internal Medicine

## 2018-08-15 DIAGNOSIS — Z1231 Encounter for screening mammogram for malignant neoplasm of breast: Secondary | ICD-10-CM

## 2018-08-17 ENCOUNTER — Ambulatory Visit
Admission: RE | Admit: 2018-08-17 | Discharge: 2018-08-17 | Disposition: A | Discharge status: Home / Self Care | Payer: PPO | Source: Ambulatory Visit

## 2018-08-17 ENCOUNTER — Other Ambulatory Visit: Payer: Self-pay

## 2018-08-17 DIAGNOSIS — Z1231 Encounter for screening mammogram for malignant neoplasm of breast: Secondary | ICD-10-CM | POA: Diagnosis not present

## 2018-08-31 DIAGNOSIS — M8589 Other specified disorders of bone density and structure, multiple sites: Secondary | ICD-10-CM | POA: Diagnosis not present

## 2018-10-17 DIAGNOSIS — E559 Vitamin D deficiency, unspecified: Secondary | ICD-10-CM | POA: Diagnosis not present

## 2018-10-17 DIAGNOSIS — M859 Disorder of bone density and structure, unspecified: Secondary | ICD-10-CM | POA: Diagnosis not present

## 2018-10-20 DIAGNOSIS — Z23 Encounter for immunization: Secondary | ICD-10-CM | POA: Diagnosis not present

## 2018-11-02 ENCOUNTER — Encounter (HOSPITAL_COMMUNITY): Payer: PPO

## 2018-11-06 DIAGNOSIS — Z85828 Personal history of other malignant neoplasm of skin: Secondary | ICD-10-CM | POA: Diagnosis not present

## 2018-11-06 DIAGNOSIS — D2272 Melanocytic nevi of left lower limb, including hip: Secondary | ICD-10-CM | POA: Diagnosis not present

## 2018-11-06 DIAGNOSIS — L821 Other seborrheic keratosis: Secondary | ICD-10-CM | POA: Diagnosis not present

## 2018-11-06 DIAGNOSIS — L2089 Other atopic dermatitis: Secondary | ICD-10-CM | POA: Diagnosis not present

## 2018-11-06 DIAGNOSIS — L738 Other specified follicular disorders: Secondary | ICD-10-CM | POA: Diagnosis not present

## 2018-11-06 DIAGNOSIS — D225 Melanocytic nevi of trunk: Secondary | ICD-10-CM | POA: Diagnosis not present

## 2019-02-02 DIAGNOSIS — R202 Paresthesia of skin: Secondary | ICD-10-CM | POA: Diagnosis not present

## 2019-06-06 DIAGNOSIS — H35372 Puckering of macula, left eye: Secondary | ICD-10-CM | POA: Diagnosis not present

## 2019-06-06 DIAGNOSIS — Z961 Presence of intraocular lens: Secondary | ICD-10-CM | POA: Diagnosis not present

## 2019-06-06 DIAGNOSIS — H02834 Dermatochalasis of left upper eyelid: Secondary | ICD-10-CM | POA: Diagnosis not present

## 2019-06-06 DIAGNOSIS — H02403 Unspecified ptosis of bilateral eyelids: Secondary | ICD-10-CM | POA: Diagnosis not present

## 2019-06-06 DIAGNOSIS — H40033 Anatomical narrow angle, bilateral: Secondary | ICD-10-CM | POA: Diagnosis not present

## 2019-06-06 DIAGNOSIS — H04123 Dry eye syndrome of bilateral lacrimal glands: Secondary | ICD-10-CM | POA: Diagnosis not present

## 2019-07-09 DIAGNOSIS — L249 Irritant contact dermatitis, unspecified cause: Secondary | ICD-10-CM | POA: Diagnosis not present

## 2019-07-09 DIAGNOSIS — L239 Allergic contact dermatitis, unspecified cause: Secondary | ICD-10-CM | POA: Diagnosis not present

## 2019-07-09 DIAGNOSIS — L282 Other prurigo: Secondary | ICD-10-CM | POA: Diagnosis not present

## 2019-07-09 DIAGNOSIS — Z85828 Personal history of other malignant neoplasm of skin: Secondary | ICD-10-CM | POA: Diagnosis not present

## 2019-07-23 DIAGNOSIS — Z79899 Other long term (current) drug therapy: Secondary | ICD-10-CM | POA: Diagnosis not present

## 2019-07-23 DIAGNOSIS — E559 Vitamin D deficiency, unspecified: Secondary | ICD-10-CM | POA: Diagnosis not present

## 2019-07-30 DIAGNOSIS — Z1331 Encounter for screening for depression: Secondary | ICD-10-CM | POA: Diagnosis not present

## 2019-07-30 DIAGNOSIS — R202 Paresthesia of skin: Secondary | ICD-10-CM | POA: Diagnosis not present

## 2019-07-30 DIAGNOSIS — J309 Allergic rhinitis, unspecified: Secondary | ICD-10-CM | POA: Diagnosis not present

## 2019-07-30 DIAGNOSIS — E559 Vitamin D deficiency, unspecified: Secondary | ICD-10-CM | POA: Diagnosis not present

## 2019-07-30 DIAGNOSIS — M179 Osteoarthritis of knee, unspecified: Secondary | ICD-10-CM | POA: Diagnosis not present

## 2019-07-30 DIAGNOSIS — Z1212 Encounter for screening for malignant neoplasm of rectum: Secondary | ICD-10-CM | POA: Diagnosis not present

## 2019-07-30 DIAGNOSIS — Z0000 Encounter for general adult medical examination without abnormal findings: Secondary | ICD-10-CM | POA: Diagnosis not present

## 2019-07-30 DIAGNOSIS — R82998 Other abnormal findings in urine: Secondary | ICD-10-CM | POA: Diagnosis not present

## 2019-07-30 DIAGNOSIS — M8580 Other specified disorders of bone density and structure, unspecified site: Secondary | ICD-10-CM | POA: Diagnosis not present

## 2019-08-09 ENCOUNTER — Other Ambulatory Visit: Payer: Self-pay | Admitting: Obstetrics and Gynecology

## 2019-08-09 DIAGNOSIS — Z1231 Encounter for screening mammogram for malignant neoplasm of breast: Secondary | ICD-10-CM

## 2019-08-20 ENCOUNTER — Ambulatory Visit
Admission: RE | Admit: 2019-08-20 | Discharge: 2019-08-20 | Disposition: A | Discharge status: Home / Self Care | Payer: PPO | Source: Ambulatory Visit | Attending: Obstetrics and Gynecology | Admitting: Obstetrics and Gynecology

## 2019-08-20 ENCOUNTER — Other Ambulatory Visit: Payer: Self-pay

## 2019-08-20 DIAGNOSIS — Z1231 Encounter for screening mammogram for malignant neoplasm of breast: Secondary | ICD-10-CM

## 2019-08-22 ENCOUNTER — Other Ambulatory Visit: Payer: Self-pay | Admitting: Obstetrics and Gynecology

## 2019-08-22 DIAGNOSIS — R928 Other abnormal and inconclusive findings on diagnostic imaging of breast: Secondary | ICD-10-CM

## 2019-09-03 ENCOUNTER — Other Ambulatory Visit: Payer: Self-pay | Admitting: Internal Medicine

## 2019-09-04 ENCOUNTER — Ambulatory Visit: Payer: PPO

## 2019-09-04 ENCOUNTER — Other Ambulatory Visit: Payer: Self-pay

## 2019-09-04 ENCOUNTER — Ambulatory Visit
Admission: RE | Admit: 2019-09-04 | Discharge: 2019-09-04 | Disposition: A | Discharge status: Home / Self Care | Payer: PPO | Source: Ambulatory Visit | Attending: Obstetrics and Gynecology | Admitting: Obstetrics and Gynecology

## 2019-09-04 DIAGNOSIS — R928 Other abnormal and inconclusive findings on diagnostic imaging of breast: Secondary | ICD-10-CM

## 2019-09-04 DIAGNOSIS — R922 Inconclusive mammogram: Secondary | ICD-10-CM | POA: Diagnosis not present

## 2019-09-12 DIAGNOSIS — L820 Inflamed seborrheic keratosis: Secondary | ICD-10-CM | POA: Diagnosis not present

## 2019-09-12 DIAGNOSIS — L821 Other seborrheic keratosis: Secondary | ICD-10-CM | POA: Diagnosis not present

## 2019-09-12 DIAGNOSIS — L239 Allergic contact dermatitis, unspecified cause: Secondary | ICD-10-CM | POA: Diagnosis not present

## 2019-09-12 DIAGNOSIS — Z85828 Personal history of other malignant neoplasm of skin: Secondary | ICD-10-CM | POA: Diagnosis not present

## 2019-10-03 DIAGNOSIS — Z01419 Encounter for gynecological examination (general) (routine) without abnormal findings: Secondary | ICD-10-CM | POA: Diagnosis not present

## 2019-10-03 DIAGNOSIS — M859 Disorder of bone density and structure, unspecified: Secondary | ICD-10-CM | POA: Diagnosis not present

## 2019-11-07 DIAGNOSIS — Z23 Encounter for immunization: Secondary | ICD-10-CM | POA: Diagnosis not present

## 2019-11-13 DIAGNOSIS — L300 Nummular dermatitis: Secondary | ICD-10-CM | POA: Diagnosis not present

## 2019-11-13 DIAGNOSIS — L821 Other seborrheic keratosis: Secondary | ICD-10-CM | POA: Diagnosis not present

## 2019-11-13 DIAGNOSIS — C44611 Basal cell carcinoma of skin of unspecified upper limb, including shoulder: Secondary | ICD-10-CM | POA: Diagnosis not present

## 2019-11-13 DIAGNOSIS — D2272 Melanocytic nevi of left lower limb, including hip: Secondary | ICD-10-CM | POA: Diagnosis not present

## 2019-11-13 DIAGNOSIS — D225 Melanocytic nevi of trunk: Secondary | ICD-10-CM | POA: Diagnosis not present

## 2019-11-13 DIAGNOSIS — Z85828 Personal history of other malignant neoplasm of skin: Secondary | ICD-10-CM | POA: Diagnosis not present

## 2019-11-13 DIAGNOSIS — L308 Other specified dermatitis: Secondary | ICD-10-CM | POA: Diagnosis not present

## 2019-11-13 DIAGNOSIS — C44612 Basal cell carcinoma of skin of right upper limb, including shoulder: Secondary | ICD-10-CM | POA: Diagnosis not present

## 2020-02-27 DIAGNOSIS — H35372 Puckering of macula, left eye: Secondary | ICD-10-CM | POA: Diagnosis not present

## 2020-02-27 DIAGNOSIS — H02403 Unspecified ptosis of bilateral eyelids: Secondary | ICD-10-CM | POA: Diagnosis not present

## 2020-02-27 DIAGNOSIS — H04123 Dry eye syndrome of bilateral lacrimal glands: Secondary | ICD-10-CM | POA: Diagnosis not present

## 2020-02-27 DIAGNOSIS — Z961 Presence of intraocular lens: Secondary | ICD-10-CM | POA: Diagnosis not present

## 2020-02-27 DIAGNOSIS — H02834 Dermatochalasis of left upper eyelid: Secondary | ICD-10-CM | POA: Diagnosis not present

## 2020-07-15 DIAGNOSIS — R197 Diarrhea, unspecified: Secondary | ICD-10-CM | POA: Diagnosis not present

## 2020-08-06 DIAGNOSIS — E559 Vitamin D deficiency, unspecified: Secondary | ICD-10-CM | POA: Diagnosis not present

## 2020-08-06 DIAGNOSIS — Z0000 Encounter for general adult medical examination without abnormal findings: Secondary | ICD-10-CM | POA: Diagnosis not present

## 2020-08-13 DIAGNOSIS — N951 Menopausal and female climacteric states: Secondary | ICD-10-CM | POA: Diagnosis not present

## 2020-08-13 DIAGNOSIS — Z0000 Encounter for general adult medical examination without abnormal findings: Secondary | ICD-10-CM | POA: Diagnosis not present

## 2020-08-13 DIAGNOSIS — Z1331 Encounter for screening for depression: Secondary | ICD-10-CM | POA: Diagnosis not present

## 2020-08-13 DIAGNOSIS — E559 Vitamin D deficiency, unspecified: Secondary | ICD-10-CM | POA: Diagnosis not present

## 2020-08-13 DIAGNOSIS — M179 Osteoarthritis of knee, unspecified: Secondary | ICD-10-CM | POA: Diagnosis not present

## 2020-08-13 DIAGNOSIS — Z1339 Encounter for screening examination for other mental health and behavioral disorders: Secondary | ICD-10-CM | POA: Diagnosis not present

## 2020-08-13 DIAGNOSIS — L309 Dermatitis, unspecified: Secondary | ICD-10-CM | POA: Diagnosis not present

## 2020-08-13 DIAGNOSIS — J309 Allergic rhinitis, unspecified: Secondary | ICD-10-CM | POA: Diagnosis not present

## 2020-08-13 DIAGNOSIS — M8580 Other specified disorders of bone density and structure, unspecified site: Secondary | ICD-10-CM | POA: Diagnosis not present

## 2020-08-14 DIAGNOSIS — R82998 Other abnormal findings in urine: Secondary | ICD-10-CM | POA: Diagnosis not present

## 2020-08-22 DIAGNOSIS — L570 Actinic keratosis: Secondary | ICD-10-CM | POA: Diagnosis not present

## 2020-08-22 DIAGNOSIS — L821 Other seborrheic keratosis: Secondary | ICD-10-CM | POA: Diagnosis not present

## 2020-08-22 DIAGNOSIS — L2089 Other atopic dermatitis: Secondary | ICD-10-CM | POA: Diagnosis not present

## 2020-08-22 DIAGNOSIS — L308 Other specified dermatitis: Secondary | ICD-10-CM | POA: Diagnosis not present

## 2020-08-22 DIAGNOSIS — Z85828 Personal history of other malignant neoplasm of skin: Secondary | ICD-10-CM | POA: Diagnosis not present

## 2020-09-12 DIAGNOSIS — R195 Other fecal abnormalities: Secondary | ICD-10-CM | POA: Diagnosis not present

## 2020-09-16 DIAGNOSIS — M8589 Other specified disorders of bone density and structure, multiple sites: Secondary | ICD-10-CM | POA: Diagnosis not present

## 2020-10-06 ENCOUNTER — Other Ambulatory Visit: Payer: Self-pay | Admitting: Internal Medicine

## 2020-10-06 DIAGNOSIS — Z1231 Encounter for screening mammogram for malignant neoplasm of breast: Secondary | ICD-10-CM

## 2020-10-15 DIAGNOSIS — R197 Diarrhea, unspecified: Secondary | ICD-10-CM | POA: Diagnosis not present

## 2020-10-27 ENCOUNTER — Other Ambulatory Visit: Payer: Self-pay

## 2020-10-27 ENCOUNTER — Ambulatory Visit
Admission: RE | Admit: 2020-10-27 | Discharge: 2020-10-27 | Disposition: A | Discharge status: Home / Self Care | Payer: PPO | Source: Ambulatory Visit | Attending: Internal Medicine | Admitting: Internal Medicine

## 2020-10-27 DIAGNOSIS — Z1231 Encounter for screening mammogram for malignant neoplasm of breast: Secondary | ICD-10-CM | POA: Diagnosis not present

## 2020-10-28 DIAGNOSIS — Z1211 Encounter for screening for malignant neoplasm of colon: Secondary | ICD-10-CM | POA: Diagnosis not present

## 2020-10-28 DIAGNOSIS — R195 Other fecal abnormalities: Secondary | ICD-10-CM | POA: Diagnosis not present

## 2020-11-01 DIAGNOSIS — Z23 Encounter for immunization: Secondary | ICD-10-CM | POA: Diagnosis not present

## 2020-11-07 DIAGNOSIS — Z1212 Encounter for screening for malignant neoplasm of rectum: Secondary | ICD-10-CM | POA: Diagnosis not present

## 2020-11-07 DIAGNOSIS — Z1211 Encounter for screening for malignant neoplasm of colon: Secondary | ICD-10-CM | POA: Diagnosis not present

## 2020-12-16 DIAGNOSIS — D2272 Melanocytic nevi of left lower limb, including hip: Secondary | ICD-10-CM | POA: Diagnosis not present

## 2020-12-16 DIAGNOSIS — D225 Melanocytic nevi of trunk: Secondary | ICD-10-CM | POA: Diagnosis not present

## 2020-12-16 DIAGNOSIS — L309 Dermatitis, unspecified: Secondary | ICD-10-CM | POA: Diagnosis not present

## 2020-12-16 DIAGNOSIS — L821 Other seborrheic keratosis: Secondary | ICD-10-CM | POA: Diagnosis not present

## 2020-12-16 DIAGNOSIS — Z85828 Personal history of other malignant neoplasm of skin: Secondary | ICD-10-CM | POA: Diagnosis not present

## 2020-12-18 DIAGNOSIS — E559 Vitamin D deficiency, unspecified: Secondary | ICD-10-CM | POA: Diagnosis not present

## 2020-12-18 DIAGNOSIS — M8580 Other specified disorders of bone density and structure, unspecified site: Secondary | ICD-10-CM | POA: Diagnosis not present

## 2021-01-29 DIAGNOSIS — R140 Abdominal distension (gaseous): Secondary | ICD-10-CM | POA: Diagnosis not present

## 2021-01-29 DIAGNOSIS — R195 Other fecal abnormalities: Secondary | ICD-10-CM | POA: Diagnosis not present

## 2021-02-19 DIAGNOSIS — J3081 Allergic rhinitis due to animal (cat) (dog) hair and dander: Secondary | ICD-10-CM | POA: Diagnosis not present

## 2021-02-19 DIAGNOSIS — J301 Allergic rhinitis due to pollen: Secondary | ICD-10-CM | POA: Diagnosis not present

## 2021-02-19 DIAGNOSIS — R21 Rash and other nonspecific skin eruption: Secondary | ICD-10-CM | POA: Diagnosis not present

## 2021-02-19 DIAGNOSIS — J3089 Other allergic rhinitis: Secondary | ICD-10-CM | POA: Diagnosis not present

## 2021-03-26 DIAGNOSIS — H1131 Conjunctival hemorrhage, right eye: Secondary | ICD-10-CM | POA: Diagnosis not present

## 2021-03-26 DIAGNOSIS — H04123 Dry eye syndrome of bilateral lacrimal glands: Secondary | ICD-10-CM | POA: Diagnosis not present

## 2021-04-02 DIAGNOSIS — Z124 Encounter for screening for malignant neoplasm of cervix: Secondary | ICD-10-CM | POA: Diagnosis not present

## 2021-04-02 DIAGNOSIS — Z6825 Body mass index (BMI) 25.0-25.9, adult: Secondary | ICD-10-CM | POA: Diagnosis not present

## 2021-04-02 DIAGNOSIS — N3941 Urge incontinence: Secondary | ICD-10-CM | POA: Diagnosis not present

## 2021-04-02 DIAGNOSIS — Z01411 Encounter for gynecological examination (general) (routine) with abnormal findings: Secondary | ICD-10-CM | POA: Diagnosis not present

## 2021-04-02 DIAGNOSIS — M8580 Other specified disorders of bone density and structure, unspecified site: Secondary | ICD-10-CM | POA: Diagnosis not present

## 2021-04-02 DIAGNOSIS — N952 Postmenopausal atrophic vaginitis: Secondary | ICD-10-CM | POA: Diagnosis not present

## 2021-04-02 DIAGNOSIS — Z01419 Encounter for gynecological examination (general) (routine) without abnormal findings: Secondary | ICD-10-CM | POA: Diagnosis not present

## 2021-04-09 DIAGNOSIS — M8580 Other specified disorders of bone density and structure, unspecified site: Secondary | ICD-10-CM | POA: Diagnosis not present

## 2021-04-09 DIAGNOSIS — N952 Postmenopausal atrophic vaginitis: Secondary | ICD-10-CM | POA: Diagnosis not present

## 2021-04-15 DIAGNOSIS — Z1211 Encounter for screening for malignant neoplasm of colon: Secondary | ICD-10-CM | POA: Diagnosis not present

## 2021-04-15 DIAGNOSIS — K648 Other hemorrhoids: Secondary | ICD-10-CM | POA: Diagnosis not present

## 2021-04-15 DIAGNOSIS — R195 Other fecal abnormalities: Secondary | ICD-10-CM | POA: Diagnosis not present

## 2021-08-25 DIAGNOSIS — Z0000 Encounter for general adult medical examination without abnormal findings: Secondary | ICD-10-CM | POA: Diagnosis not present

## 2021-08-25 DIAGNOSIS — E559 Vitamin D deficiency, unspecified: Secondary | ICD-10-CM | POA: Diagnosis not present

## 2021-09-01 DIAGNOSIS — J309 Allergic rhinitis, unspecified: Secondary | ICD-10-CM | POA: Diagnosis not present

## 2021-09-01 DIAGNOSIS — Z1331 Encounter for screening for depression: Secondary | ICD-10-CM | POA: Diagnosis not present

## 2021-09-01 DIAGNOSIS — M179 Osteoarthritis of knee, unspecified: Secondary | ICD-10-CM | POA: Diagnosis not present

## 2021-09-01 DIAGNOSIS — E559 Vitamin D deficiency, unspecified: Secondary | ICD-10-CM | POA: Diagnosis not present

## 2021-09-01 DIAGNOSIS — M8580 Other specified disorders of bone density and structure, unspecified site: Secondary | ICD-10-CM | POA: Diagnosis not present

## 2021-09-01 DIAGNOSIS — R82998 Other abnormal findings in urine: Secondary | ICD-10-CM | POA: Diagnosis not present

## 2021-09-01 DIAGNOSIS — Z1339 Encounter for screening examination for other mental health and behavioral disorders: Secondary | ICD-10-CM | POA: Diagnosis not present

## 2021-09-01 DIAGNOSIS — Z0000 Encounter for general adult medical examination without abnormal findings: Secondary | ICD-10-CM | POA: Diagnosis not present

## 2021-10-15 DIAGNOSIS — H10413 Chronic giant papillary conjunctivitis, bilateral: Secondary | ICD-10-CM | POA: Diagnosis not present

## 2021-10-15 DIAGNOSIS — H02835 Dermatochalasis of left lower eyelid: Secondary | ICD-10-CM | POA: Diagnosis not present

## 2021-10-15 DIAGNOSIS — H04123 Dry eye syndrome of bilateral lacrimal glands: Secondary | ICD-10-CM | POA: Diagnosis not present

## 2021-11-12 DIAGNOSIS — Z23 Encounter for immunization: Secondary | ICD-10-CM | POA: Diagnosis not present

## 2021-12-10 ENCOUNTER — Other Ambulatory Visit: Payer: Self-pay | Admitting: Internal Medicine

## 2021-12-10 DIAGNOSIS — Z1231 Encounter for screening mammogram for malignant neoplasm of breast: Secondary | ICD-10-CM

## 2021-12-29 ENCOUNTER — Ambulatory Visit
Admission: RE | Admit: 2021-12-29 | Discharge: 2021-12-29 | Disposition: A | Discharge status: Home / Self Care | Payer: PPO | Source: Ambulatory Visit | Attending: Internal Medicine | Admitting: Internal Medicine

## 2021-12-29 DIAGNOSIS — Z1231 Encounter for screening mammogram for malignant neoplasm of breast: Secondary | ICD-10-CM | POA: Diagnosis not present

## 2022-01-20 DIAGNOSIS — M542 Cervicalgia: Secondary | ICD-10-CM | POA: Diagnosis not present

## 2022-01-20 DIAGNOSIS — M545 Low back pain, unspecified: Secondary | ICD-10-CM | POA: Diagnosis not present

## 2022-01-20 DIAGNOSIS — M546 Pain in thoracic spine: Secondary | ICD-10-CM | POA: Diagnosis not present

## 2022-01-20 DIAGNOSIS — R030 Elevated blood-pressure reading, without diagnosis of hypertension: Secondary | ICD-10-CM | POA: Diagnosis not present

## 2022-01-20 DIAGNOSIS — S199XXA Unspecified injury of neck, initial encounter: Secondary | ICD-10-CM | POA: Diagnosis not present

## 2022-05-03 DIAGNOSIS — M436 Torticollis: Secondary | ICD-10-CM | POA: Diagnosis not present

## 2022-05-03 DIAGNOSIS — R0981 Nasal congestion: Secondary | ICD-10-CM | POA: Diagnosis not present

## 2022-05-03 DIAGNOSIS — Z1152 Encounter for screening for COVID-19: Secondary | ICD-10-CM | POA: Diagnosis not present

## 2022-05-03 DIAGNOSIS — R5383 Other fatigue: Secondary | ICD-10-CM | POA: Diagnosis not present

## 2022-05-03 DIAGNOSIS — R058 Other specified cough: Secondary | ICD-10-CM | POA: Diagnosis not present

## 2022-05-18 DIAGNOSIS — Z6826 Body mass index (BMI) 26.0-26.9, adult: Secondary | ICD-10-CM | POA: Diagnosis not present

## 2022-05-18 DIAGNOSIS — N952 Postmenopausal atrophic vaginitis: Secondary | ICD-10-CM | POA: Diagnosis not present

## 2022-05-18 DIAGNOSIS — Z01411 Encounter for gynecological examination (general) (routine) with abnormal findings: Secondary | ICD-10-CM | POA: Diagnosis not present

## 2022-05-18 DIAGNOSIS — M8580 Other specified disorders of bone density and structure, unspecified site: Secondary | ICD-10-CM | POA: Diagnosis not present

## 2022-05-18 DIAGNOSIS — Z01419 Encounter for gynecological examination (general) (routine) without abnormal findings: Secondary | ICD-10-CM | POA: Diagnosis not present

## 2022-05-18 DIAGNOSIS — Z124 Encounter for screening for malignant neoplasm of cervix: Secondary | ICD-10-CM | POA: Diagnosis not present

## 2022-05-19 DIAGNOSIS — Z85828 Personal history of other malignant neoplasm of skin: Secondary | ICD-10-CM | POA: Diagnosis not present

## 2022-05-19 DIAGNOSIS — L309 Dermatitis, unspecified: Secondary | ICD-10-CM | POA: Diagnosis not present

## 2022-06-14 IMAGING — MG MM DIGITAL SCREENING BILAT W/ TOMO AND CAD
8 series · 8 of 24 positions shown · non-contrast
Comparison: Previous exam(s).

CLINICAL DATA: Screening.

EXAM:
DIGITAL SCREENING BILATERAL MAMMOGRAM WITH TOMOSYNTHESIS AND CAD
TECHNIQUE: Bilateral screening digital craniocaudal and mediolateral oblique
mammograms were obtained. Bilateral screening digital breast
tomosynthesis was performed. The images were evaluated with
computer-aided detection.

[R CC synth-2D]
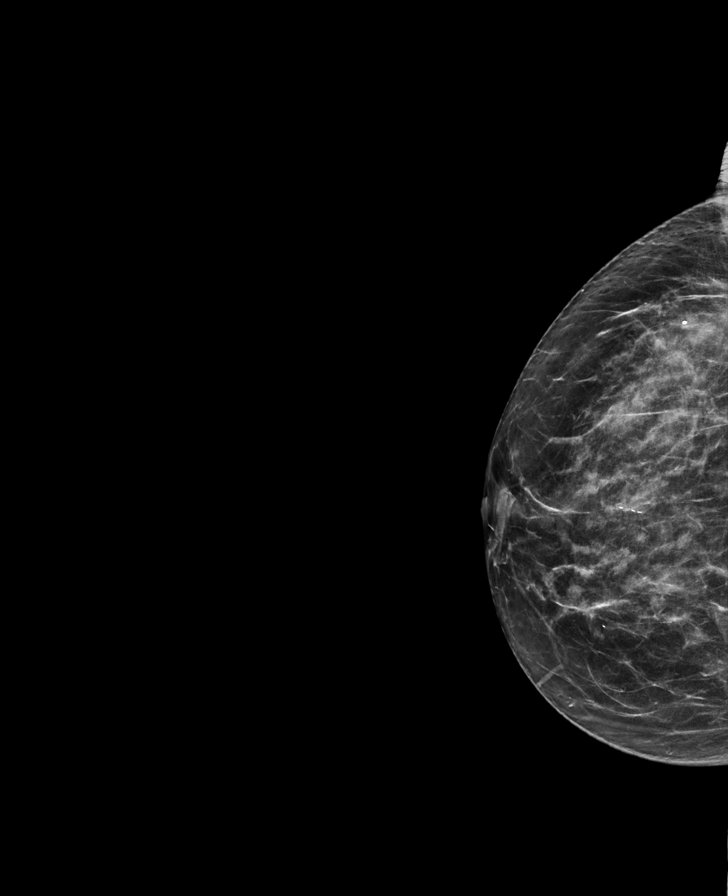

[R MLO synth-2D]
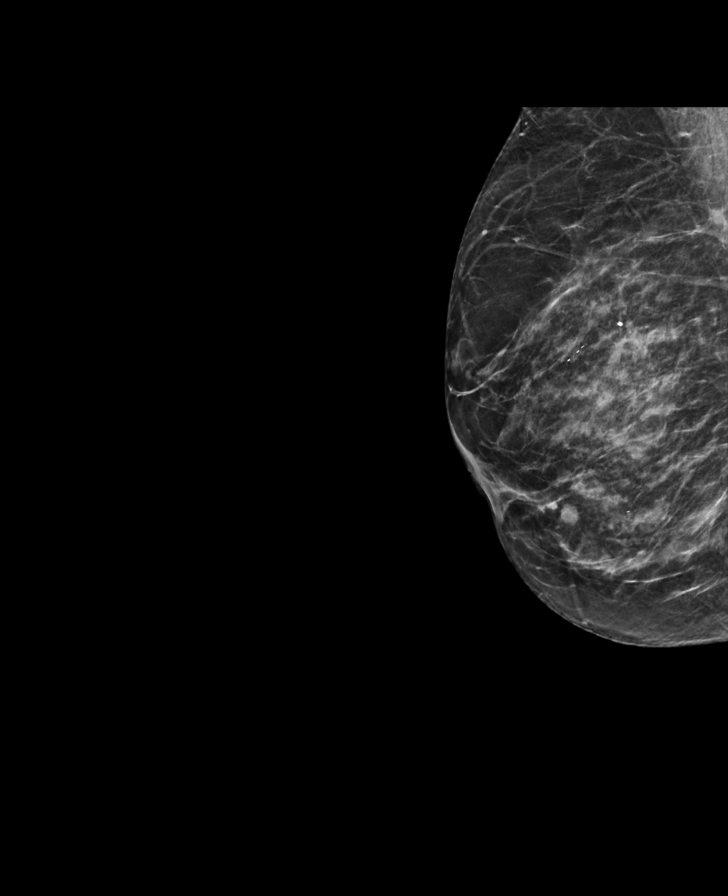

[L MLO synth-2D]
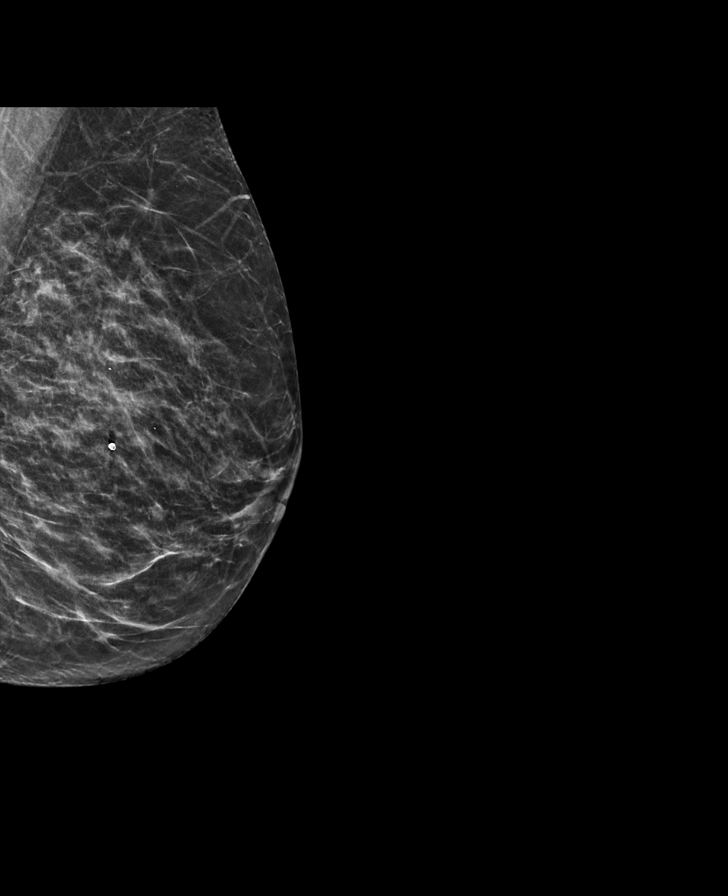

[L CC synth-2D]
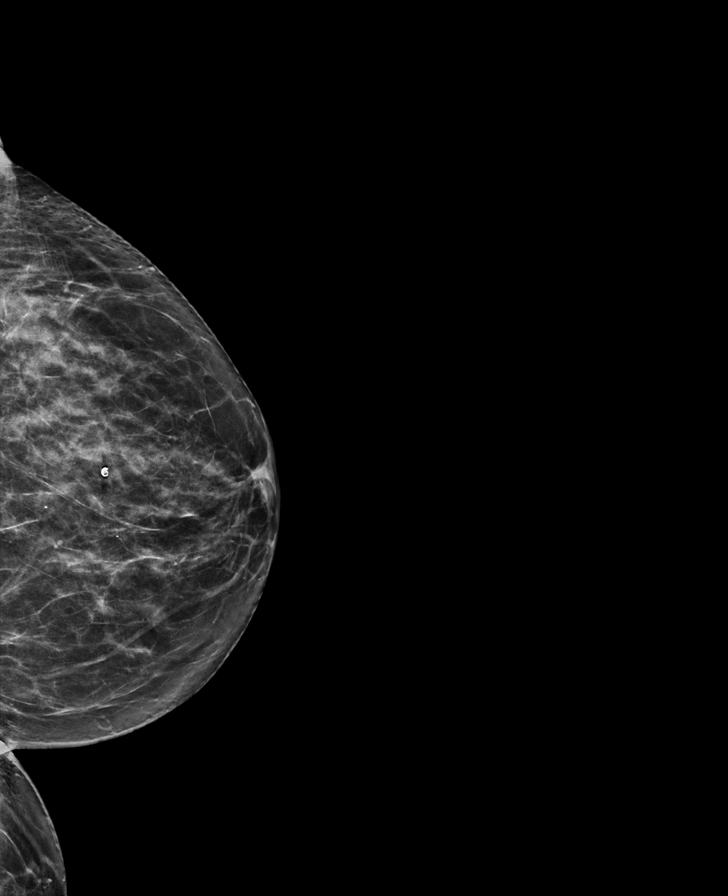

[L MLO tomo · tomo slice 29/56.0]
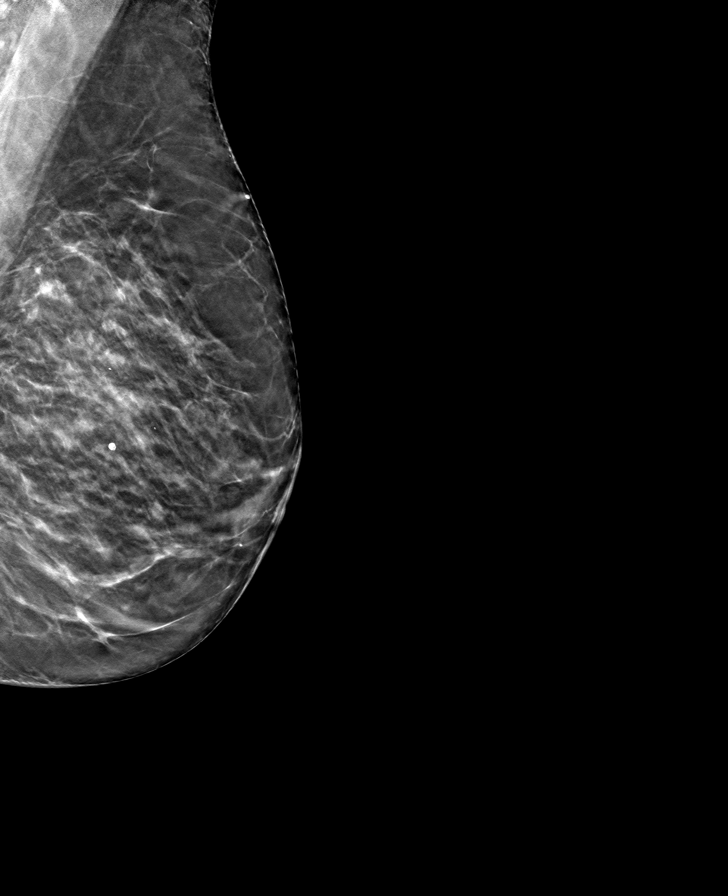

[L CC tomo · tomo slice 33/64.0]
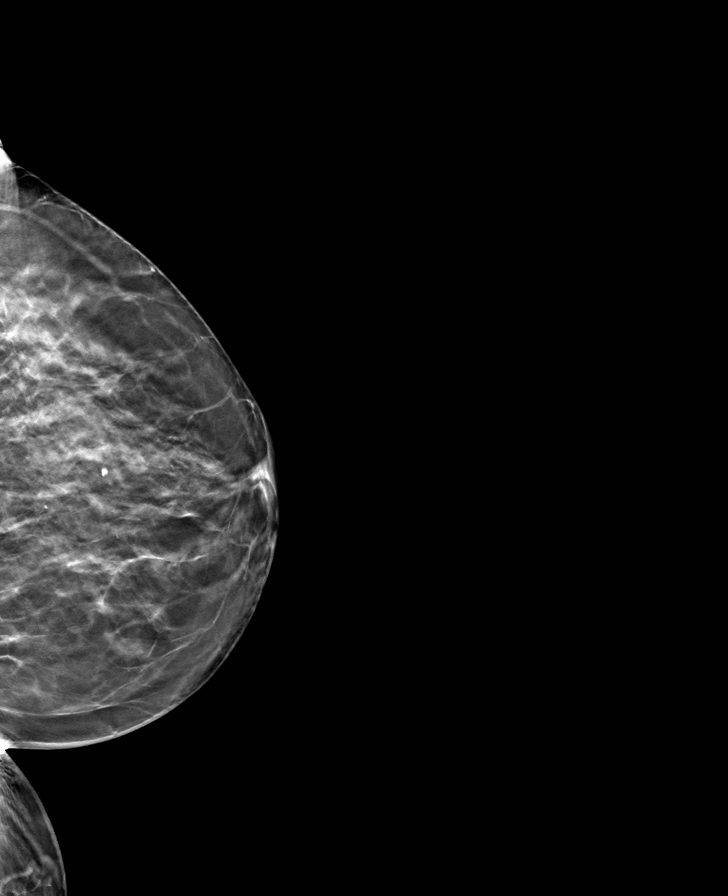

[R MLO tomo · tomo slice 29/58.0]
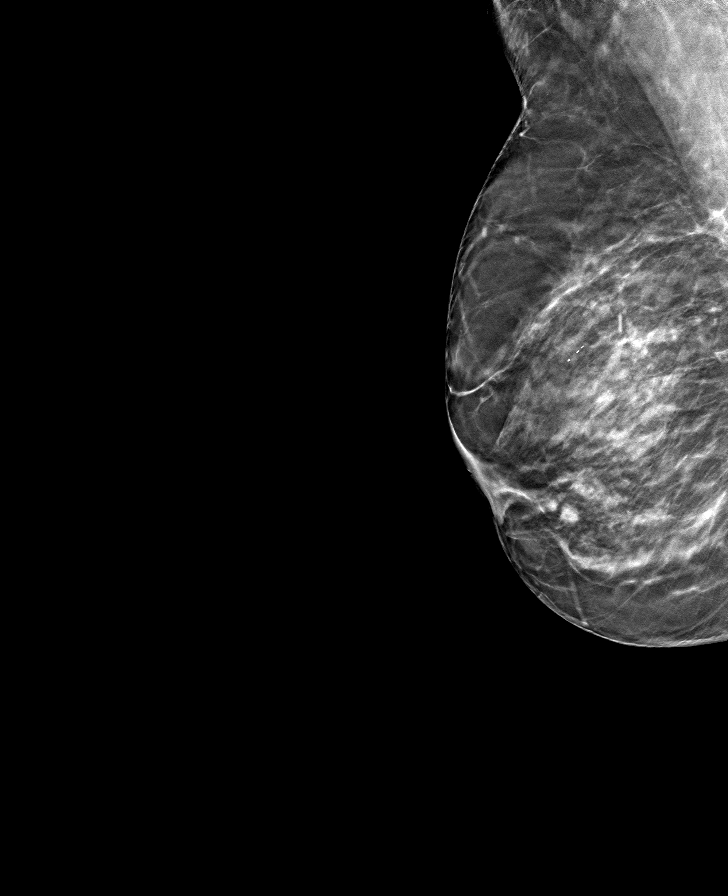

[R CC tomo · tomo slice 30/59.0]
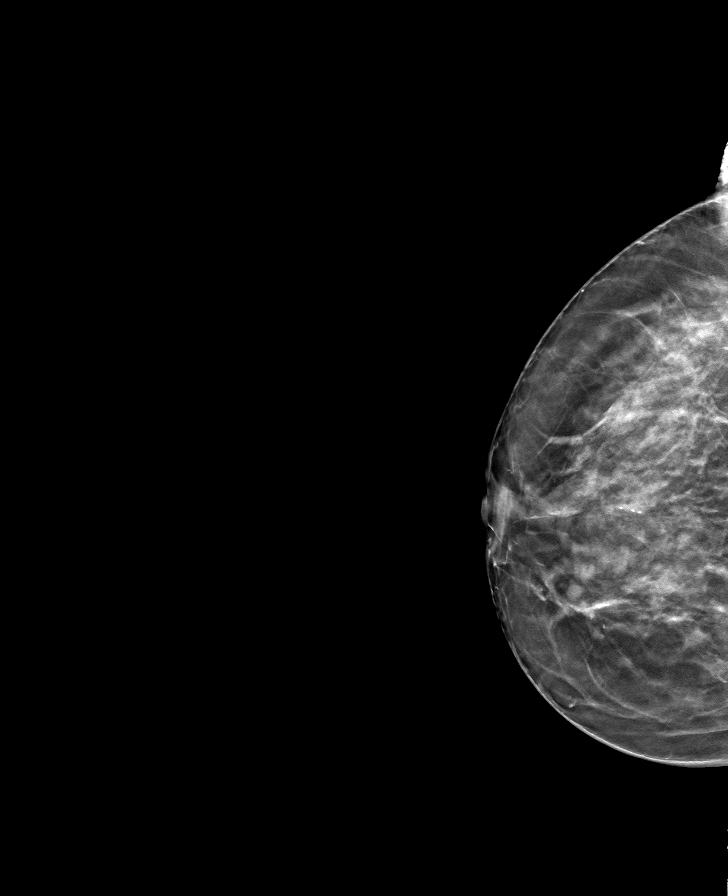

[8 of 24 positions shown; findings below may reference images not displayed]

ACR Breast Density Category c: The breast tissue is heterogeneously
dense, which may obscure small masses.
FINDINGS: There are no findings suspicious for malignancy.
IMPRESSION: No mammographic evidence of malignancy. A result letter of this
screening mammogram will be mailed directly to the patient.

RECOMMENDATION:
Screening mammogram in one year. (Code:Q3-W-BC3)

BI-RADS CATEGORY  1: Negative.

## 2022-07-12 DIAGNOSIS — L309 Dermatitis, unspecified: Secondary | ICD-10-CM | POA: Diagnosis not present

## 2022-07-12 DIAGNOSIS — D225 Melanocytic nevi of trunk: Secondary | ICD-10-CM | POA: Diagnosis not present

## 2022-07-12 DIAGNOSIS — L821 Other seborrheic keratosis: Secondary | ICD-10-CM | POA: Diagnosis not present

## 2022-07-12 DIAGNOSIS — L820 Inflamed seborrheic keratosis: Secondary | ICD-10-CM | POA: Diagnosis not present

## 2022-07-12 DIAGNOSIS — Z85828 Personal history of other malignant neoplasm of skin: Secondary | ICD-10-CM | POA: Diagnosis not present

## 2022-07-12 DIAGNOSIS — L570 Actinic keratosis: Secondary | ICD-10-CM | POA: Diagnosis not present

## 2022-07-12 DIAGNOSIS — C44612 Basal cell carcinoma of skin of right upper limb, including shoulder: Secondary | ICD-10-CM | POA: Diagnosis not present

## 2022-07-12 DIAGNOSIS — L239 Allergic contact dermatitis, unspecified cause: Secondary | ICD-10-CM | POA: Diagnosis not present

## 2022-07-12 DIAGNOSIS — D485 Neoplasm of uncertain behavior of skin: Secondary | ICD-10-CM | POA: Diagnosis not present

## 2022-09-20 DIAGNOSIS — Z0000 Encounter for general adult medical examination without abnormal findings: Secondary | ICD-10-CM | POA: Diagnosis not present

## 2022-09-20 DIAGNOSIS — E785 Hyperlipidemia, unspecified: Secondary | ICD-10-CM | POA: Diagnosis not present

## 2022-09-20 DIAGNOSIS — E559 Vitamin D deficiency, unspecified: Secondary | ICD-10-CM | POA: Diagnosis not present

## 2022-09-21 DIAGNOSIS — Z1339 Encounter for screening examination for other mental health and behavioral disorders: Secondary | ICD-10-CM | POA: Diagnosis not present

## 2022-09-21 DIAGNOSIS — J309 Allergic rhinitis, unspecified: Secondary | ICD-10-CM | POA: Diagnosis not present

## 2022-09-21 DIAGNOSIS — Z1331 Encounter for screening for depression: Secondary | ICD-10-CM | POA: Diagnosis not present

## 2022-09-21 DIAGNOSIS — M8580 Other specified disorders of bone density and structure, unspecified site: Secondary | ICD-10-CM | POA: Diagnosis not present

## 2022-09-21 DIAGNOSIS — G2581 Restless legs syndrome: Secondary | ICD-10-CM | POA: Diagnosis not present

## 2022-09-21 DIAGNOSIS — M179 Osteoarthritis of knee, unspecified: Secondary | ICD-10-CM | POA: Diagnosis not present

## 2022-09-21 DIAGNOSIS — U071 COVID-19: Secondary | ICD-10-CM | POA: Diagnosis not present

## 2022-09-21 DIAGNOSIS — E559 Vitamin D deficiency, unspecified: Secondary | ICD-10-CM | POA: Diagnosis not present

## 2022-09-21 DIAGNOSIS — Z0000 Encounter for general adult medical examination without abnormal findings: Secondary | ICD-10-CM | POA: Diagnosis not present

## 2022-10-21 DIAGNOSIS — H35372 Puckering of macula, left eye: Secondary | ICD-10-CM | POA: Diagnosis not present

## 2022-10-21 DIAGNOSIS — H52223 Regular astigmatism, bilateral: Secondary | ICD-10-CM | POA: Diagnosis not present

## 2022-10-21 DIAGNOSIS — H40033 Anatomical narrow angle, bilateral: Secondary | ICD-10-CM | POA: Diagnosis not present

## 2022-10-21 DIAGNOSIS — H524 Presbyopia: Secondary | ICD-10-CM | POA: Diagnosis not present

## 2022-10-21 DIAGNOSIS — H5213 Myopia, bilateral: Secondary | ICD-10-CM | POA: Diagnosis not present

## 2022-10-21 DIAGNOSIS — H10413 Chronic giant papillary conjunctivitis, bilateral: Secondary | ICD-10-CM | POA: Diagnosis not present

## 2022-10-21 DIAGNOSIS — H02835 Dermatochalasis of left lower eyelid: Secondary | ICD-10-CM | POA: Diagnosis not present

## 2022-11-24 DIAGNOSIS — M9903 Segmental and somatic dysfunction of lumbar region: Secondary | ICD-10-CM | POA: Diagnosis not present

## 2022-11-24 DIAGNOSIS — M9906 Segmental and somatic dysfunction of lower extremity: Secondary | ICD-10-CM | POA: Diagnosis not present

## 2022-11-24 DIAGNOSIS — M9905 Segmental and somatic dysfunction of pelvic region: Secondary | ICD-10-CM | POA: Diagnosis not present

## 2022-11-24 DIAGNOSIS — M25562 Pain in left knee: Secondary | ICD-10-CM | POA: Diagnosis not present

## 2022-11-24 DIAGNOSIS — M545 Low back pain, unspecified: Secondary | ICD-10-CM | POA: Diagnosis not present

## 2022-11-24 DIAGNOSIS — M810 Age-related osteoporosis without current pathological fracture: Secondary | ICD-10-CM | POA: Diagnosis not present

## 2022-11-24 DIAGNOSIS — G2581 Restless legs syndrome: Secondary | ICD-10-CM | POA: Diagnosis not present

## 2022-11-24 DIAGNOSIS — M25561 Pain in right knee: Secondary | ICD-10-CM | POA: Diagnosis not present

## 2022-11-24 DIAGNOSIS — M9902 Segmental and somatic dysfunction of thoracic region: Secondary | ICD-10-CM | POA: Diagnosis not present

## 2022-11-24 DIAGNOSIS — M9904 Segmental and somatic dysfunction of sacral region: Secondary | ICD-10-CM | POA: Diagnosis not present

## 2022-12-16 ENCOUNTER — Ambulatory Visit: Payer: PPO | Admitting: Family Medicine

## 2023-04-06 DIAGNOSIS — L309 Dermatitis, unspecified: Secondary | ICD-10-CM | POA: Diagnosis not present

## 2023-04-06 DIAGNOSIS — D225 Melanocytic nevi of trunk: Secondary | ICD-10-CM | POA: Diagnosis not present

## 2023-04-06 DIAGNOSIS — L821 Other seborrheic keratosis: Secondary | ICD-10-CM | POA: Diagnosis not present

## 2023-04-06 DIAGNOSIS — M7130 Other bursal cyst, unspecified site: Secondary | ICD-10-CM | POA: Diagnosis not present

## 2023-04-06 DIAGNOSIS — Z85828 Personal history of other malignant neoplasm of skin: Secondary | ICD-10-CM | POA: Diagnosis not present

## 2023-05-02 DIAGNOSIS — K5901 Slow transit constipation: Secondary | ICD-10-CM | POA: Diagnosis not present

## 2023-05-02 DIAGNOSIS — R1900 Intra-abdominal and pelvic swelling, mass and lump, unspecified site: Secondary | ICD-10-CM | POA: Diagnosis not present

## 2023-05-04 ENCOUNTER — Other Ambulatory Visit: Payer: Self-pay | Admitting: Adult Health

## 2023-05-04 DIAGNOSIS — R1900 Intra-abdominal and pelvic swelling, mass and lump, unspecified site: Secondary | ICD-10-CM

## 2023-05-09 ENCOUNTER — Ambulatory Visit
Admission: RE | Admit: 2023-05-09 | Discharge: 2023-05-09 | Disposition: A | Discharge status: Home / Self Care | Source: Ambulatory Visit | Attending: Adult Health | Admitting: Adult Health

## 2023-05-09 DIAGNOSIS — K769 Liver disease, unspecified: Secondary | ICD-10-CM | POA: Diagnosis not present

## 2023-05-09 DIAGNOSIS — R1900 Intra-abdominal and pelvic swelling, mass and lump, unspecified site: Secondary | ICD-10-CM

## 2023-05-10 ENCOUNTER — Other Ambulatory Visit: Payer: Self-pay | Admitting: Internal Medicine

## 2023-05-10 DIAGNOSIS — R160 Hepatomegaly, not elsewhere classified: Secondary | ICD-10-CM

## 2023-05-11 ENCOUNTER — Ambulatory Visit
Admission: RE | Admit: 2023-05-11 | Discharge: 2023-05-11 | Disposition: A | Discharge status: Home / Self Care | Source: Ambulatory Visit | Attending: Internal Medicine | Admitting: Internal Medicine

## 2023-05-11 DIAGNOSIS — I700 Atherosclerosis of aorta: Secondary | ICD-10-CM | POA: Diagnosis not present

## 2023-05-11 DIAGNOSIS — R160 Hepatomegaly, not elsewhere classified: Secondary | ICD-10-CM

## 2023-05-11 DIAGNOSIS — K7689 Other specified diseases of liver: Secondary | ICD-10-CM | POA: Diagnosis not present

## 2023-05-11 MED ORDER — IOPAMIDOL (ISOVUE-300) INJECTION 61%
500.0000 mL | Freq: Once | INTRAVENOUS | Status: AC | PRN
  Administered 2023-05-11: 80 mL via INTRAVENOUS

## 2023-05-25 DIAGNOSIS — R935 Abnormal findings on diagnostic imaging of other abdominal regions, including retroperitoneum: Secondary | ICD-10-CM | POA: Diagnosis not present

## 2023-06-02 DIAGNOSIS — K449 Diaphragmatic hernia without obstruction or gangrene: Secondary | ICD-10-CM | POA: Diagnosis not present

## 2023-06-02 DIAGNOSIS — R933 Abnormal findings on diagnostic imaging of other parts of digestive tract: Secondary | ICD-10-CM | POA: Diagnosis not present

## 2023-06-02 DIAGNOSIS — D132 Benign neoplasm of duodenum: Secondary | ICD-10-CM | POA: Diagnosis not present

## 2023-06-02 DIAGNOSIS — K3189 Other diseases of stomach and duodenum: Secondary | ICD-10-CM | POA: Diagnosis not present

## 2023-06-02 DIAGNOSIS — K222 Esophageal obstruction: Secondary | ICD-10-CM | POA: Diagnosis not present

## 2023-06-07 DIAGNOSIS — D132 Benign neoplasm of duodenum: Secondary | ICD-10-CM | POA: Diagnosis not present

## 2023-06-10 DIAGNOSIS — R197 Diarrhea, unspecified: Secondary | ICD-10-CM | POA: Diagnosis not present

## 2023-07-07 DIAGNOSIS — N952 Postmenopausal atrophic vaginitis: Secondary | ICD-10-CM | POA: Diagnosis not present

## 2023-07-07 DIAGNOSIS — R3915 Urgency of urination: Secondary | ICD-10-CM | POA: Diagnosis not present

## 2023-07-07 DIAGNOSIS — M8580 Other specified disorders of bone density and structure, unspecified site: Secondary | ICD-10-CM | POA: Diagnosis not present

## 2023-07-07 DIAGNOSIS — Z124 Encounter for screening for malignant neoplasm of cervix: Secondary | ICD-10-CM | POA: Diagnosis not present

## 2023-07-07 DIAGNOSIS — Z1231 Encounter for screening mammogram for malignant neoplasm of breast: Secondary | ICD-10-CM | POA: Diagnosis not present

## 2023-07-13 DIAGNOSIS — M5459 Other low back pain: Secondary | ICD-10-CM | POA: Diagnosis not present

## 2023-08-04 DIAGNOSIS — M5459 Other low back pain: Secondary | ICD-10-CM | POA: Diagnosis not present

## 2023-08-22 DIAGNOSIS — K317 Polyp of stomach and duodenum: Secondary | ICD-10-CM | POA: Diagnosis not present

## 2023-08-22 DIAGNOSIS — K222 Esophageal obstruction: Secondary | ICD-10-CM | POA: Diagnosis not present

## 2023-08-22 DIAGNOSIS — K449 Diaphragmatic hernia without obstruction or gangrene: Secondary | ICD-10-CM | POA: Diagnosis not present

## 2023-08-22 DIAGNOSIS — Q439 Congenital malformation of intestine, unspecified: Secondary | ICD-10-CM | POA: Diagnosis not present

## 2023-08-22 DIAGNOSIS — D132 Benign neoplasm of duodenum: Secondary | ICD-10-CM | POA: Diagnosis not present

## 2023-08-22 DIAGNOSIS — K3189 Other diseases of stomach and duodenum: Secondary | ICD-10-CM | POA: Diagnosis not present

## 2023-08-22 DIAGNOSIS — Z79899 Other long term (current) drug therapy: Secondary | ICD-10-CM | POA: Diagnosis not present

## 2023-10-10 DIAGNOSIS — Z0000 Encounter for general adult medical examination without abnormal findings: Secondary | ICD-10-CM | POA: Diagnosis not present

## 2023-10-10 DIAGNOSIS — E559 Vitamin D deficiency, unspecified: Secondary | ICD-10-CM | POA: Diagnosis not present

## 2023-10-10 DIAGNOSIS — R7989 Other specified abnormal findings of blood chemistry: Secondary | ICD-10-CM | POA: Diagnosis not present

## 2023-10-12 DIAGNOSIS — L821 Other seborrheic keratosis: Secondary | ICD-10-CM | POA: Diagnosis not present

## 2023-10-12 DIAGNOSIS — D225 Melanocytic nevi of trunk: Secondary | ICD-10-CM | POA: Diagnosis not present

## 2023-10-12 DIAGNOSIS — Z85828 Personal history of other malignant neoplasm of skin: Secondary | ICD-10-CM | POA: Diagnosis not present

## 2023-10-12 DIAGNOSIS — L603 Nail dystrophy: Secondary | ICD-10-CM | POA: Diagnosis not present

## 2023-10-12 DIAGNOSIS — D2272 Melanocytic nevi of left lower limb, including hip: Secondary | ICD-10-CM | POA: Diagnosis not present

## 2023-10-12 DIAGNOSIS — L245 Irritant contact dermatitis due to other chemical products: Secondary | ICD-10-CM | POA: Diagnosis not present

## 2023-10-13 DIAGNOSIS — Z1339 Encounter for screening examination for other mental health and behavioral disorders: Secondary | ICD-10-CM | POA: Diagnosis not present

## 2023-10-13 DIAGNOSIS — Z23 Encounter for immunization: Secondary | ICD-10-CM | POA: Diagnosis not present

## 2023-10-13 DIAGNOSIS — M179 Osteoarthritis of knee, unspecified: Secondary | ICD-10-CM | POA: Diagnosis not present

## 2023-10-13 DIAGNOSIS — Z1331 Encounter for screening for depression: Secondary | ICD-10-CM | POA: Diagnosis not present

## 2023-10-13 DIAGNOSIS — C49A0 Gastrointestinal stromal tumor, unspecified site: Secondary | ICD-10-CM | POA: Diagnosis not present

## 2023-10-13 DIAGNOSIS — Z0000 Encounter for general adult medical examination without abnormal findings: Secondary | ICD-10-CM | POA: Diagnosis not present

## 2023-10-13 DIAGNOSIS — R82998 Other abnormal findings in urine: Secondary | ICD-10-CM | POA: Diagnosis not present

## 2023-10-13 DIAGNOSIS — M5416 Radiculopathy, lumbar region: Secondary | ICD-10-CM | POA: Diagnosis not present

## 2023-10-13 DIAGNOSIS — M8580 Other specified disorders of bone density and structure, unspecified site: Secondary | ICD-10-CM | POA: Diagnosis not present

## 2023-10-13 DIAGNOSIS — J309 Allergic rhinitis, unspecified: Secondary | ICD-10-CM | POA: Diagnosis not present

## 2023-10-13 DIAGNOSIS — E559 Vitamin D deficiency, unspecified: Secondary | ICD-10-CM | POA: Diagnosis not present

## 2023-10-14 ENCOUNTER — Other Ambulatory Visit: Payer: Self-pay | Admitting: Internal Medicine

## 2023-10-14 DIAGNOSIS — M5416 Radiculopathy, lumbar region: Secondary | ICD-10-CM

## 2023-10-17 ENCOUNTER — Other Ambulatory Visit (HOSPITAL_COMMUNITY): Payer: Self-pay | Admitting: Internal Medicine

## 2023-10-17 DIAGNOSIS — Z23 Encounter for immunization: Secondary | ICD-10-CM | POA: Diagnosis not present

## 2023-10-17 DIAGNOSIS — M810 Age-related osteoporosis without current pathological fracture: Secondary | ICD-10-CM | POA: Insufficient documentation

## 2023-10-18 ENCOUNTER — Telehealth: Payer: Self-pay | Admitting: Pharmacy Technician

## 2023-10-18 NOTE — Telephone Encounter (Signed)
 Auth Submission: NO AUTH NEEDED Site of care: Site of care: [facility] Payer: [employer] Medication & CPT/J Code(s) submitted: Reclast (Zolendronic acid) [id number] Diagnosis Code: M81.0 Route of submission (phone, fax, portal):  Phone # Fax # Auth type: Buy/Bill PB Units/visits requested: X1 DOSE Reference number:  Approval from: 10/18/23 to 01/11/24

## 2023-10-25 DIAGNOSIS — H52223 Regular astigmatism, bilateral: Secondary | ICD-10-CM | POA: Diagnosis not present

## 2023-10-25 DIAGNOSIS — H524 Presbyopia: Secondary | ICD-10-CM | POA: Diagnosis not present

## 2023-10-25 DIAGNOSIS — H5213 Myopia, bilateral: Secondary | ICD-10-CM | POA: Diagnosis not present

## 2023-10-25 DIAGNOSIS — H10413 Chronic giant papillary conjunctivitis, bilateral: Secondary | ICD-10-CM | POA: Diagnosis not present

## 2023-10-25 DIAGNOSIS — H35372 Puckering of macula, left eye: Secondary | ICD-10-CM | POA: Diagnosis not present

## 2023-10-25 DIAGNOSIS — H40033 Anatomical narrow angle, bilateral: Secondary | ICD-10-CM | POA: Diagnosis not present

## 2023-10-25 DIAGNOSIS — H04123 Dry eye syndrome of bilateral lacrimal glands: Secondary | ICD-10-CM | POA: Diagnosis not present

## 2023-11-01 ENCOUNTER — Encounter (HOSPITAL_COMMUNITY)

## 2023-11-02 ENCOUNTER — Ambulatory Visit
Admission: RE | Admit: 2023-11-02 | Discharge: 2023-11-02 | Disposition: A | Source: Ambulatory Visit | Attending: Internal Medicine | Admitting: Internal Medicine

## 2023-11-02 DIAGNOSIS — M4317 Spondylolisthesis, lumbosacral region: Secondary | ICD-10-CM | POA: Diagnosis not present

## 2023-11-02 DIAGNOSIS — M5416 Radiculopathy, lumbar region: Secondary | ICD-10-CM

## 2023-11-02 DIAGNOSIS — M48061 Spinal stenosis, lumbar region without neurogenic claudication: Secondary | ICD-10-CM | POA: Diagnosis not present

## 2023-11-02 DIAGNOSIS — M5127 Other intervertebral disc displacement, lumbosacral region: Secondary | ICD-10-CM | POA: Diagnosis not present

## 2023-11-02 DIAGNOSIS — M47816 Spondylosis without myelopathy or radiculopathy, lumbar region: Secondary | ICD-10-CM | POA: Diagnosis not present

## 2023-11-04 ENCOUNTER — Encounter (HOSPITAL_COMMUNITY)

## 2023-12-05 DIAGNOSIS — M545 Low back pain, unspecified: Secondary | ICD-10-CM | POA: Diagnosis not present

## 2023-12-05 DIAGNOSIS — M5416 Radiculopathy, lumbar region: Secondary | ICD-10-CM | POA: Diagnosis not present

## 2023-12-06 DIAGNOSIS — M8589 Other specified disorders of bone density and structure, multiple sites: Secondary | ICD-10-CM | POA: Diagnosis not present

## 2023-12-29 ENCOUNTER — Ambulatory Visit
Admission: RE | Admit: 2023-12-29 | Discharge: 2023-12-29 | Disposition: A | Source: Ambulatory Visit | Attending: Nurse Practitioner | Admitting: Nurse Practitioner

## 2023-12-29 ENCOUNTER — Other Ambulatory Visit: Payer: Self-pay | Admitting: Nurse Practitioner

## 2023-12-29 DIAGNOSIS — R197 Diarrhea, unspecified: Secondary | ICD-10-CM

## 2024-01-30 ENCOUNTER — Telehealth (HOSPITAL_COMMUNITY): Payer: Self-pay

## 2024-01-30 NOTE — Telephone Encounter (Signed)
 Auth Submission: NO AUTH NEEDED Site of care: Site of care: [facility] Payer: [employer] Medication & CPT/J Code(s) submitted: Reclast (Zolendronic acid) [id number] Diagnosis Code: M81.0 Route of submission (phone, fax, portal):  Phone # Fax # Auth type: Buy/Bill HB Units/visits requested: 5mg  x 1 dose Reference number:  Approval from: 01/30/24 to 01/10/25
